# Patient Record
Sex: Female | Born: 1975 | Race: Black or African American | Hispanic: No | Marital: Single | Smoking: Never smoker
Health system: Southern US, Community
[De-identification: ages and names within clinical notes are randomized; demographics above are authoritative.]

---

## 1997-06-16 ENCOUNTER — Observation Stay (HOSPITAL_COMMUNITY): Admission: AD | Admit: 1997-06-16 | Discharge: 1997-06-17 | Payer: Self-pay | Admitting: Obstetrics & Gynecology

## 1997-11-13 ENCOUNTER — Other Ambulatory Visit: Admission: RE | Admit: 1997-11-13 | Discharge: 1997-11-13 | Payer: Self-pay | Admitting: Obstetrics

## 1998-11-17 ENCOUNTER — Emergency Department (HOSPITAL_COMMUNITY): Admission: EM | Admit: 1998-11-17 | Discharge: 1998-11-17 | Payer: Self-pay | Admitting: Emergency Medicine

## 1999-02-08 ENCOUNTER — Emergency Department (HOSPITAL_COMMUNITY): Admission: EM | Admit: 1999-02-08 | Discharge: 1999-02-08 | Payer: Self-pay | Admitting: Emergency Medicine

## 2001-02-05 ENCOUNTER — Emergency Department (HOSPITAL_COMMUNITY): Admission: EM | Admit: 2001-02-05 | Discharge: 2001-02-05 | Payer: Self-pay | Admitting: Emergency Medicine

## 2002-01-01 ENCOUNTER — Emergency Department (HOSPITAL_COMMUNITY): Admission: EM | Admit: 2002-01-01 | Discharge: 2002-01-01 | Payer: Self-pay

## 2002-04-22 ENCOUNTER — Emergency Department (HOSPITAL_COMMUNITY): Admission: EM | Admit: 2002-04-22 | Discharge: 2002-04-22 | Payer: Self-pay | Admitting: Emergency Medicine

## 2002-04-26 ENCOUNTER — Emergency Department (HOSPITAL_COMMUNITY): Admission: EM | Admit: 2002-04-26 | Discharge: 2002-04-27 | Payer: Self-pay | Admitting: Emergency Medicine

## 2004-11-13 ENCOUNTER — Emergency Department (HOSPITAL_COMMUNITY): Admission: EM | Admit: 2004-11-13 | Discharge: 2004-11-13 | Payer: Self-pay | Admitting: Family Medicine

## 2006-07-03 ENCOUNTER — Emergency Department (HOSPITAL_COMMUNITY): Admission: EM | Admit: 2006-07-03 | Discharge: 2006-07-04 | Payer: Self-pay | Admitting: Emergency Medicine

## 2006-07-08 ENCOUNTER — Emergency Department (HOSPITAL_COMMUNITY): Admission: EM | Admit: 2006-07-08 | Discharge: 2006-07-08 | Payer: Self-pay | Admitting: Family Medicine

## 2006-08-17 ENCOUNTER — Emergency Department (HOSPITAL_COMMUNITY): Admission: EM | Admit: 2006-08-17 | Discharge: 2006-08-17 | Payer: Self-pay | Admitting: Emergency Medicine

## 2008-02-28 ENCOUNTER — Emergency Department (HOSPITAL_COMMUNITY): Admission: EM | Admit: 2008-02-28 | Discharge: 2008-02-28 | Payer: Self-pay | Admitting: Family Medicine

## 2008-05-19 ENCOUNTER — Emergency Department (HOSPITAL_COMMUNITY): Admission: EM | Admit: 2008-05-19 | Discharge: 2008-05-19 | Payer: Self-pay | Admitting: Family Medicine

## 2008-05-24 ENCOUNTER — Emergency Department (HOSPITAL_COMMUNITY): Admission: EM | Admit: 2008-05-24 | Discharge: 2008-05-24 | Payer: Self-pay | Admitting: Family Medicine

## 2010-05-06 LAB — WET PREP, GENITAL: Trich, Wet Prep: NONE SEEN

## 2010-05-06 LAB — POCT URINALYSIS DIP (DEVICE)
Bilirubin Urine: NEGATIVE
Nitrite: POSITIVE — AB
Protein, ur: >=300 mg/dL — AB
Urobilinogen, UA: 2 mg/dL — ABNORMAL HIGH (ref 0.0–1.0)
pH: 6 (ref 5.0–8.0)
pH: 8.5 — ABNORMAL HIGH (ref 5.0–8.0)

## 2010-05-06 LAB — GC/CHLAMYDIA PROBE AMP, GENITAL
Chlamydia, DNA Probe: NEGATIVE
GC Probe Amp, Genital: NEGATIVE

## 2010-05-06 LAB — URINE CULTURE: Colony Count: 15000

## 2010-05-06 LAB — POCT PREGNANCY, URINE: Preg Test, Ur: NEGATIVE

## 2010-11-09 LAB — DIFFERENTIAL
Eosinophils Absolute: 0
Lymphocytes Relative: 10 — ABNORMAL LOW
Lymphs Abs: 0.7
Monocytes Relative: 5
Neutrophils Relative %: 86 — ABNORMAL HIGH

## 2010-11-09 LAB — URINE MICROSCOPIC-ADD ON

## 2010-11-09 LAB — CBC
MCHC: 32.8
RDW: 13.1

## 2010-11-09 LAB — URINALYSIS, ROUTINE W REFLEX MICROSCOPIC
Glucose, UA: NEGATIVE
Ketones, ur: 40 — AB
Nitrite: NEGATIVE
Specific Gravity, Urine: 1.015
pH: 7.5

## 2010-11-09 LAB — COMPREHENSIVE METABOLIC PANEL
ALT: 17
AST: 35
Calcium: 9.3
Creatinine, Ser: 0.73
GFR calc Af Amer: >60
Glucose, Bld: 96
Sodium: 133 — ABNORMAL LOW
Total Protein: 7.9

## 2010-11-12 LAB — I-STAT 8, (EC8 V) (CONVERTED LAB)
Acid-base deficit: 2
BUN: 5 — ABNORMAL LOW
Bicarbonate: 22.7
Chloride: 109
Glucose, Bld: 85
HCT: 37
Hemoglobin: 12.6
Operator id: 239701
Potassium: 4
Sodium: 140
TCO2: 24
pCO2, Ven: 39.6 — ABNORMAL LOW
pH, Ven: 7.366 — ABNORMAL HIGH

## 2010-11-12 LAB — URINALYSIS, ROUTINE W REFLEX MICROSCOPIC
Bilirubin Urine: NEGATIVE
Glucose, UA: NEGATIVE
Ketones, ur: NEGATIVE
Protein, ur: NEGATIVE
Urobilinogen, UA: 1

## 2010-11-12 LAB — URINE MICROSCOPIC-ADD ON

## 2010-11-12 LAB — HEMOGLOBIN AND HEMATOCRIT, BLOOD: HCT: 36.6

## 2010-11-12 LAB — PREGNANCY, URINE: Preg Test, Ur: NEGATIVE

## 2011-03-10 ENCOUNTER — Encounter (HOSPITAL_COMMUNITY): Payer: Self-pay | Admitting: *Deleted

## 2011-03-10 ENCOUNTER — Emergency Department (HOSPITAL_COMMUNITY): Payer: Self-pay

## 2011-03-10 ENCOUNTER — Emergency Department (HOSPITAL_COMMUNITY)
Admission: EM | Admit: 2011-03-10 | Discharge: 2011-03-10 | Disposition: A | Discharge status: Home / Self Care | Payer: Self-pay | Attending: Emergency Medicine | Admitting: Emergency Medicine

## 2011-03-10 DIAGNOSIS — J189 Pneumonia, unspecified organism: Secondary | ICD-10-CM | POA: Insufficient documentation

## 2011-03-10 DIAGNOSIS — R059 Cough, unspecified: Secondary | ICD-10-CM | POA: Insufficient documentation

## 2011-03-10 DIAGNOSIS — R062 Wheezing: Secondary | ICD-10-CM | POA: Insufficient documentation

## 2011-03-10 DIAGNOSIS — R05 Cough: Secondary | ICD-10-CM | POA: Insufficient documentation

## 2011-03-10 MED ORDER — HYDROCODONE-HOMATROPINE 5-1.5 MG/5ML PO SYRP: 5.0000 mL | ORAL_SOLUTION | Freq: Four times a day (QID) | ORAL | Status: AC | PRN

## 2011-03-10 MED ORDER — AZITHROMYCIN 250 MG PO TABS: ORAL_TABLET | ORAL | Status: AC

## 2011-03-10 NOTE — ED Notes (Signed)
Pt states she is just recently getting over the flu and still has a nagging dry cough. Pt denies any chest pain. Pt states she not having a sore throat just a cough

## 2011-03-10 NOTE — Discharge Instructions (Signed)
Take medications as prescribed. Followup with your doctor in regards to your hospital visit. If you do not have a doctor use the resource guide listed below to help you find one. You may return to the emergency department if symptoms worsen, become progressive, or become more concerning.  Pneumonia, Adult Pneumonia is an infection of the lungs.  CAUSES Pneumonia may be caused by bacteria or a virus. Usually, these infections are caused by breathing infectious particles into the lungs (respiratory tract). SYMPTOMS   Cough.   Fever.   Chest pain.   Increased rate of breathing.   Wheezing.   Mucus production.  DIAGNOSIS  If you have the common symptoms of pneumonia, your caregiver will typically confirm the diagnosis with a chest X-ray. The X-ray will show an abnormality in the lung (pulmonary infiltrate) if you have pneumonia. Other tests of your blood, urine, or sputum may be done to find the specific cause of your pneumonia. Your caregiver may also do tests (blood gases or pulse oximetry) to see how well your lungs are working. TREATMENT  Some forms of pneumonia may be spread to other people when you cough or sneeze. You may be asked to wear a mask before and during your exam. Pneumonia that is caused by bacteria is treated with antibiotic medicine. Pneumonia that is caused by the influenza virus may be treated with an antiviral medicine. Most other viral infections must run their course. These infections will not respond to antibiotics.  PREVENTION A pneumococcal shot (vaccine) is available to prevent a common bacterial cause of pneumonia. This is usually suggested for:  People over 65 years old.   Patients on chemotherapy.   People with chronic lung problems, such as bronchitis or emphysema.   People with immune system problems.  If you are over 65 or have a high risk condition, you may receive the pneumococcal vaccine if you have not received it before. In some countries, a  routine influenza vaccine is also recommended. This vaccine can help prevent some cases of pneumonia.You may be offered the influenza vaccine as part of your care. If you smoke, it is time to quit. You may receive instructions on how to stop smoking. Your caregiver can provide medicines and counseling to help you quit. HOME CARE INSTRUCTIONS   Cough suppressants may be used if you are losing too much rest. However, coughing protects you by clearing your lungs. You should avoid using cough suppressants if you can.   Your caregiver may have prescribed medicine if he or she thinks your pneumonia is caused by a bacteria or influenza. Finish your medicine even if you start to feel better.   Your caregiver may also prescribe an expectorant. This loosens the mucus to be coughed up.   Only take over-the-counter or prescription medicines for pain, discomfort, or fever as directed by your caregiver.   Do not smoke. Smoking is a common cause of bronchitis and can contribute to pneumonia. If you are a smoker and continue to smoke, your cough may last several weeks after your pneumonia has cleared.   A cold steam vaporizer or humidifier in your room or home may help loosen mucus.   Coughing is often worse at night. Sleeping in a semi-upright position in a recliner or using a couple pillows under your head will help with this.   Get rest as you feel it is needed. Your body will usually let you know when you need to rest.  SEEK IMMEDIATE MEDICAL CARE IF:     Your illness becomes worse. This is especially true if you are elderly or weakened from any other disease.   You cannot control your cough with suppressants and are losing sleep.   You begin coughing up blood.   You develop pain which is getting worse or is uncontrolled with medicines.   You have a fever.   Any of the symptoms which initially brought you in for treatment are getting worse rather than better.   You develop shortness of breath or  chest pain.  MAKE SURE YOU:   Understand these instructions.   Will watch your condition.   Will get help right away if you are not doing well or get worse.   RESOURCE GUIDE  Dental Problems  Patients with Medicaid: [facility]                     [facility] [address]                                           [address] Phone:  [phone]                                                  Phone:  [phone]  If unable to pay or uninsured, contact:  [facility] or [facility] to become qualified for the adult dental clinic.  Chronic Pain Problems Contact [facility]  [phone] Patients need to be referred by their primary care doctor.  Insufficient Money for Medicine Contact [employer]:  call "211" or [facility] 271-5999.  No Primary Care Doctor Call [facility]  832-8000 Other agencies that provide inexpensive medical care    [facility]  832-8035    [facility]  832-7272    [facility]  [phone]    [facility] Clinic  832-4777    [facility]  [phone]    [facility]  272-1050  Psychological Services [facility]  832-9600 [facility]  [phone] [facility]   800 853-5163 (emergency services 641-4993)  Substance Abuse Resources [facility]  336-882-2125 [facility] 336-784-9470 [facility] 336-285-9073 [facility] 336-845-3988 Residential & Outpatient Substance Abuse Program  800-659-3381  Abuse/Neglect [place] Child Abuse Hotline (336) 641-3795 [place] [facility] Abuse Hotline 800-378-5315 (After Hours)  Emergency Shelter [facility] (336) 271-5985  [facility] (336) 275-9566 [facility] (704) 372-4663  MRSA Hotline #:   832-7006    [place] [facility]  [facility] [place]     [employer]                          [facility] [address] [place]                       [address]      [address]  [place]                                                  [place]                            [place] Phone:  [phone]                                   Phone:  [phone]                 Phone:  [phone]  [facility] Phone:  342-8316  [place] Child Abuse Hotline (336) 342-1394 (336) 342-3537 (After Hours)   

## 2011-03-10 NOTE — ED Provider Notes (Signed)
Medical screening examination/treatment/procedure(s) were performed by non-physician practitioner and as supervising physician I was immediately available for consultation/collaboration.  Loucille Takach, MD 03/10/11 1614 

## 2011-03-10 NOTE — ED Provider Notes (Signed)
History     CSN: [id number]  Arrival date & time 03/10/11  1133   First MD Initiated Contact with Patient 03/10/11 1143      Chief Complaint  Patient presents with  . Cough    (Consider location/radiation/quality/duration/timing/severity/associated sxs/prior treatment) HPI Comments: Patient presents emergency Department with chief complaint of cough.  Patient states she recently got over the flu and she's had a lingering cough for the last 2 weeks.  Patient denies sore throat, fevers, night sweats, chills, headache, earache, shortness of breath, chest pain.  Patient is a complaint this time.  Patient denies a history of smoking,  Asthma, or COPD.  Patient is a 36 y.o. female presenting with cough. The history is provided by the patient.  Cough This is a new problem. The current episode started more than 1 week ago. The problem occurs every few minutes. The problem has not changed since onset.The cough is non-productive. There has been no fever. Pertinent negatives include no chest pain, no chills, no sweats, no ear congestion, no ear pain, no headaches, no rhinorrhea, no sore throat, no myalgias, no shortness of breath, no wheezing and no eye redness. She has tried cough syrup for the symptoms. The treatment provided mild relief. She is not a smoker. Her past medical history does not include bronchitis, pneumonia, bronchiectasis, COPD, emphysema or asthma.    History reviewed. No pertinent past medical history.  History reviewed. No pertinent past surgical history.  History reviewed. No pertinent family history.  History  Substance Use Topics  . Smoking status: Never Smoker   . Smokeless tobacco: Not on file  . Alcohol Use: Yes     occa    OB History    Grav Para Term Preterm Abortions TAB SAB Ect Mult Living                  Review of Systems  Constitutional: Negative for chills.  HENT: Negative for ear pain, sore throat and rhinorrhea.   Eyes: Negative for redness.    Respiratory: Positive for cough. Negative for shortness of breath and wheezing.   Cardiovascular: Negative for chest pain.  Musculoskeletal: Negative for myalgias.  Neurological: Negative for headaches.  All other systems reviewed and are negative.    Allergies  Penicillins  Home Medications   Current Outpatient Rx  Name Route Sig Dispense Refill  . GUAIFENESIN 100 MG/5ML PO SYRP Oral Take 200 mg by mouth 3 (three) times daily as needed. For congestion    . NYQUIL D COLD/FLU PO Oral Take 30 mLs by mouth at bedtime.    Marland Kitchen PSEUDOEPHEDRINE-GUAIFENESIN ER 60-600 MG PO TB12 Oral Take 1 tablet by mouth every 12 (twelve) hours.      BP 113/71  Pulse 90  Temp(Src) 98.7 F (37.1 C) (Oral)  Resp 20  Ht 5' (1.524 m)  Wt 130 lb (58.968 kg)  BMI 25.39 kg/m2  SpO2 99%  LMP 02/24/2011  Physical Exam  Nursing note and vitals reviewed. Constitutional: She is oriented to person, place, and time. She appears well-developed and well-nourished. No distress.  HENT:  Head: Normocephalic and atraumatic. No trismus in the jaw.  Right Ear: External ear normal. No drainage or tenderness. No mastoid tenderness.  Left Ear: External ear normal. No drainage or tenderness. No mastoid tenderness.  Nose: Nose normal. No rhinorrhea or sinus tenderness.  Mouth/Throat: Uvula is midline, oropharynx is clear and moist and mucous membranes are normal. No uvula swelling. No oropharyngeal exudate.  Eyes: Conjunctivae and  EOM are normal. Right eye exhibits no discharge. Left eye exhibits no discharge. No scleral icterus.  Neck: Normal range of motion. Neck supple.  Cardiovascular: Normal rate, regular rhythm and normal heart sounds.   Pulmonary/Chest: Effort normal and breath sounds normal. No stridor. No respiratory distress. She has no wheezes. She exhibits tenderness.  Abdominal: Soft. There is no tenderness.  Musculoskeletal: Normal range of motion.  Neurological: She is alert and oriented to person, place,  and time.  Skin: Skin is warm and dry. No rash noted. She is not diaphoretic.  Psychiatric: She has a normal mood and affect. Her behavior is normal.    ED Course  Procedures (including critical care time)  Labs Reviewed - No data to display Dg Chest 2 View  03/10/2011  *RADIOLOGY REPORT*  Clinical Data: Cough and congestion.  CHEST - 2 VIEW  Comparison: None.  Findings: Trachea is midline.  Heart size normal.  There is left perihilar air space opacification.  Lungs are otherwise clear.  No pleural fluid.  IMPRESSION: Left perihilar air space opacification is likely due to pneumonia. Follow-up to clearing is recommended.  Original Report Authenticated By: Reyes Ivan, M.D.     No diagnosis found.    MDM  CAP   Patient has been diagnosed with CAP via chest xray. Pt is not ill appearing, immunocompromised, and does not have multiple co morbidities, therefore I feel like the they can be treated as an OP with abx therapy. Pt has been advised to return to the ED if symptoms worsen or they do not improve. Pt has been advised to call [facility] to help her find a PCP to follow up with to assure pneumonia has been treated appropriately. Pt verbalizes understanding and is agreeable with plan.         Jaci Carrel, [place] 03/10/11 1258

## 2012-01-31 ENCOUNTER — Emergency Department (HOSPITAL_COMMUNITY)
Admission: EM | Admit: 2012-01-31 | Discharge: 2012-02-01 | Disposition: A | Discharge status: Home / Self Care | Payer: Medicaid Other | Attending: Emergency Medicine | Admitting: Emergency Medicine

## 2012-01-31 ENCOUNTER — Encounter (HOSPITAL_COMMUNITY): Payer: Self-pay | Admitting: Emergency Medicine

## 2012-01-31 DIAGNOSIS — H5710 Ocular pain, unspecified eye: Secondary | ICD-10-CM | POA: Insufficient documentation

## 2012-01-31 DIAGNOSIS — Y93G3 Activity, cooking and baking: Secondary | ICD-10-CM | POA: Insufficient documentation

## 2012-01-31 DIAGNOSIS — R51 Headache: Secondary | ICD-10-CM | POA: Insufficient documentation

## 2012-01-31 DIAGNOSIS — Y929 Unspecified place or not applicable: Secondary | ICD-10-CM | POA: Insufficient documentation

## 2012-01-31 DIAGNOSIS — T300 Burn of unspecified body region, unspecified degree: Secondary | ICD-10-CM

## 2012-01-31 DIAGNOSIS — X12XXXA Contact with other hot fluids, initial encounter: Secondary | ICD-10-CM | POA: Insufficient documentation

## 2012-01-31 DIAGNOSIS — T23009A Burn of unspecified degree of unspecified hand, unspecified site, initial encounter: Secondary | ICD-10-CM | POA: Insufficient documentation

## 2012-01-31 MED ORDER — HYDROCODONE-ACETAMINOPHEN 5-325 MG PO TABS: 1.0000 | ORAL_TABLET | ORAL | Status: DC | PRN

## 2012-01-31 NOTE — ED Notes (Signed)
The pts rt and lt eyes iis 2040
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
## 2012-02-01 NOTE — ED Provider Notes (Signed)
Medical screening examination/treatment/procedure(s) were performed by non-physician practitioner and as supervising physician I was immediately available for consultation/collaboration.  Krystel Fletchall M Konstantin Lehnen, MD 02/01/12 0720 

## 2012-03-01 ENCOUNTER — Other Ambulatory Visit (HOSPITAL_COMMUNITY): Payer: Self-pay | Admitting: Obstetrics

## 2012-03-01 DIAGNOSIS — Z1231 Encounter for screening mammogram for malignant neoplasm of breast: Secondary | ICD-10-CM

## 2012-03-09 ENCOUNTER — Ambulatory Visit (HOSPITAL_COMMUNITY): Admission: RE | Admit: 2012-03-09 | Payer: Medicaid Other | Source: Ambulatory Visit

## 2012-03-16 ENCOUNTER — Ambulatory Visit (HOSPITAL_COMMUNITY)
Admission: RE | Admit: 2012-03-16 | Discharge: 2012-03-16 | Disposition: A | Discharge status: Home / Self Care | Payer: Medicaid Other | Source: Ambulatory Visit | Attending: Obstetrics | Admitting: Obstetrics

## 2012-03-16 DIAGNOSIS — Z1231 Encounter for screening mammogram for malignant neoplasm of breast: Secondary | ICD-10-CM | POA: Insufficient documentation

## 2012-06-28 ENCOUNTER — Encounter (HOSPITAL_COMMUNITY): Payer: Self-pay | Admitting: *Deleted

## 2012-06-28 ENCOUNTER — Emergency Department (HOSPITAL_COMMUNITY)
Admission: EM | Admit: 2012-06-28 | Discharge: 2012-06-28 | Disposition: A | Discharge status: Home / Self Care | Payer: Medicaid Other | Source: Home / Self Care | Attending: Family Medicine | Admitting: Family Medicine

## 2012-06-28 DIAGNOSIS — K0889 Other specified disorders of teeth and supporting structures: Secondary | ICD-10-CM

## 2012-06-28 MED ORDER — KETOROLAC TROMETHAMINE 10 MG PO TABS: 10.0000 mg | ORAL_TABLET | Freq: Four times a day (QID) | ORAL | Status: DC | PRN

## 2012-06-28 MED ORDER — CLINDAMYCIN HCL 150 MG PO CAPS: 150.0000 mg | ORAL_CAPSULE | Freq: Four times a day (QID) | ORAL | Status: DC

## 2012-06-28 NOTE — ED Provider Notes (Addendum)
History     CSN: [id number]  Arrival date & time 06/28/12  1527   First MD Initiated Contact with Patient 06/28/12 1534      Chief Complaint  Patient presents with  . Dental Pain    (Consider location/radiation/quality/duration/timing/severity/associated sxs/prior treatment) Patient is a 37 y.o. female presenting with tooth pain. The history is provided by the patient.  Dental Pain Location:  Lower Lower teeth location:  18/LL 2nd molar Quality:  Aching Severity:  Moderate Duration:  1 week Progression:  Worsening Chronicity:  New Context: dental caries   Associated symptoms: gum swelling   Associated symptoms: no fever     History reviewed. No pertinent past medical history.  History reviewed. No pertinent past surgical history.  History reviewed. No pertinent family history.  History  Substance Use Topics  . Smoking status: Never Smoker   . Smokeless tobacco: Not on file  . Alcohol Use: Yes     Comment: occa    OB History   Grav Para Term Preterm Abortions TAB SAB Ect Mult Living                  Review of Systems  Constitutional: Negative.  Negative for fever.  HENT: Positive for dental problem.     Allergies  Penicillins  Home Medications   Current Outpatient Rx  Name  Route  Sig  Dispense  Refill  . clindamycin (CLEOCIN) 150 MG capsule   Oral   Take 1 capsule (150 mg total) by mouth 4 (four) times daily.   28 capsule   0   . HYDROcodone-acetaminophen (NORCO) 5-325 MG per tablet   Oral   Take 1 tablet by mouth every 4 (four) hours as needed for pain.   8 tablet   0   . ketorolac (TORADOL) 10 MG tablet   Oral   Take 1 tablet (10 mg total) by mouth every 6 (six) hours as needed for pain.   28 tablet   0   . Pseudoeph-Doxylamine-DM-APAP (NYQUIL D COLD/FLU PO)   Oral   Take 30 mLs by mouth at bedtime.           BP 120/70  Pulse 72  Temp(Src) 99.2 F (37.3 C) (Oral)  Resp 14  SpO2 98%  LMP 06/20/2012  Physical Exam  Nursing  note and vitals reviewed. Constitutional: She appears well-developed and well-nourished.  HENT:  Head: Normocephalic.  Right Ear: External ear normal.  Left Ear: External ear normal.  Mouth/Throat: Oropharynx is clear and moist and mucous membranes are normal.      ED Course  Procedures (including critical care time)  Labs Reviewed - No data to display No results found.   1. Pain, dental       MDM          Linna Hoff, MD 06/28/12 4098  Linna Hoff, MD 06/28/12 [phone]

## 2012-06-28 NOTE — ED Notes (Signed)
Pt  Reports  She  Bit  Into  Some  Peanut  Brittle    About  1  Week  Ago  And   Felt  Pain  Pt  Says  She  Has  A  Cracked  Tooth    In that  Area       Pt  Reports      Pain  And  Swelling in the  l  Lower   Jaw  Area

## 2012-06-29 NOTE — ED Notes (Signed)
Patient arrived out front and questioned having stomach upset/diarrhea/dizziness.  Spoke to dr Lorenz Coaster, reported patient needed to stop clindamycin and ketorolac.  Called into [facility] erythromycin 333 1 tid po, #30, generic flagyl 500mg  1 tid#30, tramadol 50mg  #30, 2 every 8 hours as needed for pain   .  [facility] [address] and notified patient of change in medicine

## 2012-06-29 NOTE — ED Notes (Addendum)
Spoke to pharmacy: reported erythromycin 333 is not paid by medicaid, only 400 or 250, dr Lorenz Coaster aware and ordered 400mg , change made-pharmacy accepted order change

## 2012-10-23 ENCOUNTER — Emergency Department (HOSPITAL_COMMUNITY)
Admission: EM | Admit: 2012-10-23 | Discharge: 2012-10-23 | Disposition: A | Discharge status: Home / Self Care | Payer: Self-pay | Source: Home / Self Care | Attending: Emergency Medicine | Admitting: Emergency Medicine

## 2012-10-23 ENCOUNTER — Encounter (HOSPITAL_COMMUNITY): Payer: Self-pay | Admitting: Emergency Medicine

## 2012-10-23 DIAGNOSIS — K0401 Reversible pulpitis: Secondary | ICD-10-CM

## 2012-10-23 MED ORDER — OXYCODONE-ACETAMINOPHEN 5-325 MG PO TABS: ORAL_TABLET | ORAL | Status: DC

## 2012-10-23 MED ORDER — HYDROCODONE-ACETAMINOPHEN 5-325 MG PO TABS: 2.0000 | ORAL_TABLET | Freq: Once | ORAL | Status: DC

## 2012-10-23 MED ORDER — MELOXICAM 15 MG PO TABS: 15.0000 mg | ORAL_TABLET | Freq: Every day | ORAL | Status: DC

## 2012-10-23 MED ORDER — HYDROCODONE-ACETAMINOPHEN 5-325 MG PO TABS
ORAL_TABLET | ORAL | Status: AC
  Filled 2012-10-23: qty 2

## 2012-10-23 MED ORDER — ERYTHROMYCIN BASE 333 MG PO TBEC: 333.0000 mg | DELAYED_RELEASE_TABLET | Freq: Three times a day (TID) | ORAL | Status: DC

## 2012-10-23 NOTE — ED Provider Notes (Signed)
Chief Complaint:   Chief Complaint  Patient presents with  . Dental Pain    History of Present Illness:   Amy Medina is a 37 year old female who has had a toothache for the past 2 days. She ate a candy apple and her right, upper wisdom tooth cracked. She has multiple carious teeth and is scheduled to get 6 teeth pulled next week. The tooth is sensitive and painful. It hurts to chew on that side. There is no swelling of the gingiva or the cheek. She's had no fever or chills. She has had a slight headache but no ear or facial pain. No neck pain or swelling. No difficulty swallowing or breathing. No chest pain or shortness of breath.  Review of Systems:  Other than noted above, the patient denies any of the following symptoms: Systemic:  No fever, chills,  Or sweats. ENT:  No headache, ear ache, sore throat, nasal congestion, facial pain, or swelling. Lymphatic:  No adenopathy. Lungs:  No coughing, wheezing or shortness of breath.  PMFSH:  Past medical history, family history, social history, meds, and allergies were reviewed. She is allergic to penicillin, amoxicillin, and clindamycin.  Physical Exam:   Vital signs:  LMP 10/09/2012 General:  Alert, oriented, in no distress. ENT:  TMs and canals normal.  Nasal mucosa normal. Mouth exam:  Her right upper wisdom tooth is broken off posteriorly. There is no swelling of the gingiva. She also has a crack in her right lower wisdom tooth. There is no swelling of the floor the mouth. The pharynx was clear and widely patent. Neck:  No swelling or adenopathy. Lungs:  Breath sounds clear and equal bilaterally.  No wheezes, rales or rhonchi. Heart:  Regular rhythm.  No gallops or murmers. Skin:  Clear, warm and dry.   Course in Urgent Care Center:   Since she was driving home from here she was not given any pain medication here, but does have a prescription for Percocet that she can take when she gets home.  Assessment:  The encounter diagnosis  was Pulpitis.  Plan:   1.  Meds:  The following meds were prescribed:   Discharge Medication List as of 10/23/2012  8:45 PM    START taking these medications   Details  erythromycin (ERY-TAB) 333 MG EC tablet Take 1 tablet (333 mg total) by mouth 3 (three) times daily., Starting 10/23/2012, Until Discontinued, Normal    meloxicam (MOBIC) 15 MG tablet Take 1 tablet (15 mg total) by mouth daily., Starting 10/23/2012, Until Discontinued, Normal    oxyCODONE-acetaminophen (PERCOCET) 5-325 MG per tablet 1 to 2 tablets every 6 hours as needed for pain., Print        2.  Patient Education/Counseling:  The patient was given appropriate handouts, self care instructions, and instructed in symptomatic relief. Suggested sleeping with head of bed elevated and hot salt water mouthwash.   3.  Follow up:  The patient was told to follow up if no better in 3 to 4 days, if becoming worse in any way, and given some red flag symptoms such as difficulty swallowing or breathing which would prompt immediate return.  Follow up with a dentist as soon as posssible.     Reuben Likes, MD 10/23/12 [phone]

## 2012-10-23 NOTE — ED Notes (Signed)
C/o dental pain on right top of mouth for a couple of days now.  Patient is scheduled to get six teeth pulled next week.

## 2013-03-17 ENCOUNTER — Emergency Department (HOSPITAL_COMMUNITY)
Admission: EM | Admit: 2013-03-17 | Discharge: 2013-03-17 | Disposition: A | Discharge status: Home / Self Care | Payer: Medicaid Other | Source: Home / Self Care | Attending: Emergency Medicine | Admitting: Emergency Medicine

## 2013-03-17 ENCOUNTER — Encounter (HOSPITAL_COMMUNITY): Payer: Self-pay | Admitting: Emergency Medicine

## 2013-03-17 DIAGNOSIS — K089 Disorder of teeth and supporting structures, unspecified: Secondary | ICD-10-CM

## 2013-03-17 DIAGNOSIS — K0889 Other specified disorders of teeth and supporting structures: Secondary | ICD-10-CM

## 2013-03-17 MED ORDER — OXYCODONE-ACETAMINOPHEN 5-325 MG PO TABS
ORAL_TABLET | ORAL | Status: DC
Start: 2013-03-17 — End: 2013-03-19

## 2013-03-17 MED ORDER — METRONIDAZOLE 500 MG PO TABS: 500.0000 mg | ORAL_TABLET | Freq: Two times a day (BID) | ORAL | Status: DC

## 2013-03-17 MED ORDER — FLUCONAZOLE 150 MG PO TABS: 150.0000 mg | ORAL_TABLET | Freq: Once | ORAL | Status: DC

## 2013-03-17 MED ORDER — DOXYCYCLINE HYCLATE 100 MG PO TABS: 100.0000 mg | ORAL_TABLET | Freq: Two times a day (BID) | ORAL | Status: DC

## 2013-03-17 NOTE — ED Notes (Signed)
Pt    Reports  Pain in  Her  Mouth    Pt     Reports   Scheduled    To have  Teeth  Pulled          In the  Near  Future        Pt  Reports  She  Has  Taken  Motrin and  hydrocodone  For  The  Pain

## 2013-03-17 NOTE — ED Provider Notes (Signed)
  Chief Complaint   Chief Complaint  Patient presents with  . Dental Pain    History of Present Illness   Amy Medina is a 38 year old female who has had a several week history of pain in all of her teeth, both right and left, upper and lower. It's worse in the right side. There is no swelling. It hurts to chew on either side. She has no difficulty opening her mouth. No trouble swallowing or breathing. The pain radiates to her ear is, the top of her head and over her entire face. She denies any fever or chills, or shortness of breath.  Review of Systems   Other than as noted above, the patient denies any of the following symptoms: Systemic:  No fever, chills,  Or sweats. ENT:  No headache, ear ache, sore throat, nasal congestion, facial pain, or swelling. Lymphatic:  No adenopathy. Lungs:  No coughing, wheezing or shortness of breath.  [place]   Past medical history, family history, social history, meds, and allergies were reviewed. She's allergic to penicillin, clindamycin, and Toradol. She's currently taking ibuprofen and hydrocodone without relief.  Physical Examination     Vital signs:  BP 133/90  Pulse 84  Temp(Src) 98.9 F (37.2 C) (Oral)  Resp 16  SpO2 100%  LMP 01/25/2013 General:  Alert, oriented, in no distress. ENT:  TMs and canals normal.  Nasal mucosa normal. Mouth exam:  Teeth were in surprisingly good repair. She has one wisdom tooth that is decayed on the left side in the mandible. No other visible decay. No swelling of the gingiva. No swelling of the tongue floor the mouth. The pharynx was clear airway widely patent. Neck:  No swelling or adenopathy. Lungs:  Breath sounds clear and equal bilaterally.  No wheezes, rales or rhonchi. Heart:  Regular rhythm.  No gallops or murmers. Skin:  Clear, warm and dry.   Assessment   The encounter diagnosis was Pain, dental.  She's got widespread dental pain without much the way of objective findings. She does have  an appointment to see her dentist in a week or 2. Since she has multiple allergies we'll treat with doxycycline and metronidazole.  Plan   1.  Meds:  The following meds were prescribed:   New Prescriptions   DOXYCYCLINE (VIBRA-TABS) 100 MG TABLET    Take 1 tablet (100 mg total) by mouth 2 (two) times daily.   FLUCONAZOLE (DIFLUCAN) 150 MG TABLET    Take 1 tablet (150 mg total) by mouth once.   METRONIDAZOLE (FLAGYL) 500 MG TABLET    Take 1 tablet (500 mg total) by mouth 2 (two) times daily.   OXYCODONE-ACETAMINOPHEN (PERCOCET) 5-325 MG PER TABLET    1 to 2 tablets every 6 hours as needed for pain.    2.  Patient Education/Counseling:  The patient was given appropriate handouts, self care instructions, and instructed in symptomatic relief. Suggested sleeping with head of bed elevated and hot salt water mouthwash.   3.  Follow up:  The patient was told to follow up if no better in 3 to 4 days, if becoming worse in any way, and given some red flag symptoms such as difficulty swallowing or breathing which would prompt immediate return.  Follow up with a dentist as soon as posssible.     Harden Mo, MD 03/17/13 [phone]

## 2013-03-17 NOTE — Discharge Instructions (Signed)
Look up the [employer] for free dental clinics. Http://www.ncdental.org/ncds/Schedule.asp ° °Get there early and be prepared to wait. [employer] and [employer] have dental hygienist schools that provide low cost routine dental care.  ° °Other resources: °[facility] °[address] °[place], [place] °[phone] ° °Patients with Medicaid: °[facility]                     [facility] °[address]                                [address] °Phone:  [phone]                                                  Phone:  [phone] ° °If unable to pay or uninsured, contact:  [facility] or [facility] to become qualified for the adult dental clinic. ° °No matter what dental problem you have, it will not get better unless you get good dental care.  If the tooth is not taken care of, your symptoms will come back in time and you will be visiting us again in the Urgent Care Center with a bad toothache.  So, see your dentist as soon as possible.  If you don't have a dentist, we can give you a list of dentists.  Sometimes the most cost effective treatment is removal of the tooth.  This can be done very inexpensively through one of the low cost [facility] Centers such as the facility on [address] in [place] ([phone]).  The downside to this is that you will have one less tooth and this can effect your ability to chew. ° °Some other things that can be done for a dental infection include the following: ° °· Rinse your mouth out with hot salt water (1/2 tsp of table salt and a pinch of baking soda in 8 oz of hot water).  You can do this every 2 or 3 hours. °· Avoid cold foods, beverages, and cold air.  This will make your symptoms worse. °· Sleep with your head elevated.  Sleeping flat will cause your gums and oral tissues to swell and make them hurt more.  You can sleep on several pillows.  Even  better is to sleep in a recliner with your head higher than your heart. °· For mild to moderate pain, you can take Tylenol, ibuprofen, or Aleve. °· External application of heat by a heating pad, hot water bottle, or hot wet towel can help with pain and speed healing.  You can do this every 2 to 3 hours. Do not fall asleep on a heating pad since this can cause a burn.  °·  °

## 2013-03-19 ENCOUNTER — Emergency Department (HOSPITAL_COMMUNITY)
Admission: EM | Admit: 2013-03-19 | Discharge: 2013-03-19 | Disposition: A | Discharge status: Home / Self Care | Payer: Medicaid Other | Attending: Emergency Medicine | Admitting: Emergency Medicine

## 2013-03-19 ENCOUNTER — Encounter (HOSPITAL_COMMUNITY): Payer: Self-pay | Admitting: Emergency Medicine

## 2013-03-19 DIAGNOSIS — R51 Headache: Secondary | ICD-10-CM | POA: Insufficient documentation

## 2013-03-19 DIAGNOSIS — R112 Nausea with vomiting, unspecified: Secondary | ICD-10-CM | POA: Insufficient documentation

## 2013-03-19 DIAGNOSIS — Z792 Long term (current) use of antibiotics: Secondary | ICD-10-CM | POA: Insufficient documentation

## 2013-03-19 DIAGNOSIS — J029 Acute pharyngitis, unspecified: Secondary | ICD-10-CM | POA: Insufficient documentation

## 2013-03-19 DIAGNOSIS — R220 Localized swelling, mass and lump, head: Secondary | ICD-10-CM | POA: Insufficient documentation

## 2013-03-19 DIAGNOSIS — Z880 Allergy status to penicillin: Secondary | ICD-10-CM | POA: Insufficient documentation

## 2013-03-19 DIAGNOSIS — H9209 Otalgia, unspecified ear: Secondary | ICD-10-CM | POA: Insufficient documentation

## 2013-03-19 DIAGNOSIS — Z791 Long term (current) use of non-steroidal anti-inflammatories (NSAID): Secondary | ICD-10-CM | POA: Insufficient documentation

## 2013-03-19 DIAGNOSIS — R221 Localized swelling, mass and lump, neck: Secondary | ICD-10-CM

## 2013-03-19 DIAGNOSIS — R110 Nausea: Secondary | ICD-10-CM

## 2013-03-19 DIAGNOSIS — K029 Dental caries, unspecified: Secondary | ICD-10-CM | POA: Insufficient documentation

## 2013-03-19 MED ORDER — ONDANSETRON HCL 4 MG PO TABS: 4.0000 mg | ORAL_TABLET | Freq: Four times a day (QID) | ORAL | Status: DC

## 2013-03-19 NOTE — Discharge Instructions (Signed)
Nausea, Adult Nausea is the feeling that you have an upset stomach or have to vomit. Nausea by itself is not likely a serious concern, but it may be an early sign of more serious medical problems. As nausea gets worse, it can lead to vomiting. If vomiting develops, there is the risk of dehydration.  CAUSES   Viral infections.  Food poisoning.  Medicines.  Pregnancy.  Motion sickness.  Migraine headaches.  Emotional distress.  Severe pain from any source.  Alcohol intoxication. HOME CARE INSTRUCTIONS  Get plenty of rest.  Ask your caregiver about specific rehydration instructions.  Eat small amounts of food and sip liquids more often.  Take all medicines as told by your caregiver. SEEK MEDICAL CARE IF:  You have not improved after 2 days, or you get worse.  You have a headache. SEEK IMMEDIATE MEDICAL CARE IF:   You have a fever.  You faint.  You keep vomiting or have blood in your vomit.  You are extremely weak or dehydrated.  You have dark or bloody stools.  You have severe chest or abdominal pain. MAKE SURE YOU:  Understand these instructions.  Will watch your condition.  Will get help right away if you are not doing well or get worse. Document Released: 02/19/2004 Document Revised: 10/06/2011 Document Reviewed: 09/23/2010 [facility] Patient Information 2014 [place], [place]. As discussed please take the Zofran tablet 30 minutes prior to taking antibiotic I would definitely take the doxycycline and your Percocet.  I don't, think that the Flagyl is necessary, and would eliminate this. Please call your dentist on a daily basis to see if there is a opening sooner than your scheduled appointment

## 2013-03-19 NOTE — ED Provider Notes (Signed)
CSN: [id number]     Arrival date & time 03/19/13  1909 History  This chart was scribed for non-physician practitioner Junius Creamer, NP working with Saddie Benders. Dorna Mai, MD by Zettie Pho, ED Scribe. This patient was seen in room WTR5/WTR5 and the patient's care was started at 8:51 PM.    Chief Complaint  Patient presents with  . Dental Pain   The history is provided by the patient. No language interpreter was used.   HPI Comments: Amy Medina is a 38 y.o. female who presents to the Emergency Department complaining of a constant pain diffusely to the dentition, worse on the right (upper and lower). She reports some associated swelling to the right side of the face, right ear pain, sore throat, and diffuse headache onset earlier today. Patient was seen at [facility] 2 days ago for similar complaints and was discharged with doxycycline, Flagyl, and 30 tablets of Percocet 5-325 mg with advice to follow up with a dentist. She states that has been taking the medications, but that it caused persistent nausea with multiple episodes of emesis. She reports that she has an appointment to follow up with a dentist in 1-2 weeks. Patient reports allergies to Toradol, clindamycin, and penicillins. Patient has no other pertinent medical history.   History reviewed. No pertinent past medical history. History reviewed. No pertinent past surgical history. No family history on file. History  Substance Use Topics  . Smoking status: Never Smoker   . Smokeless tobacco: Not on file  . Alcohol Use: Yes     Comment: occa   OB History   Grav Para Term Preterm Abortions TAB SAB Ect Mult Living                 Review of Systems  Constitutional: Negative for fever.  HENT: Positive for dental problem, ear pain, facial swelling and sore throat.   Gastrointestinal: Positive for nausea and vomiting.  Musculoskeletal: Negative for joint swelling.  Skin: Negative for rash.  Neurological: Positive for  headaches.  All other systems reviewed and are negative.   Allergies  Toradol; Clindamycin/lincomycin; and Penicillins  Home Medications   Current Outpatient Rx  Name  Route  Sig  Dispense  Refill  . doxycycline (VIBRA-TABS) 100 MG tablet   Oral   Take 1 tablet (100 mg total) by mouth 2 (two) times daily.   20 tablet   0   . HYDROcodone-acetaminophen (NORCO) 5-325 MG per tablet   Oral   Take 1 tablet by mouth every 4 (four) hours as needed for pain.   8 tablet   0   . ibuprofen (ADVIL,MOTRIN) 200 MG tablet   Oral   Take 600 mg by mouth every 6 (six) hours as needed (pain).         . metroNIDAZOLE (FLAGYL) 500 MG tablet   Oral   Take 1 tablet (500 mg total) by mouth 2 (two) times daily.   14 tablet   0   . oxyCODONE-acetaminophen (PERCOCET) 5-325 MG per tablet      1 to 2 tablets every 6 hours as needed for pain.   30 tablet   0   . ondansetron (ZOFRAN) 4 MG tablet   Oral   Take 1 tablet (4 mg total) by mouth every 6 (six) hours.   12 tablet   0    Triage Vitals: BP 130/87  Temp(Src) 98.4 F (36.9 C) (Oral)  Resp 18  Ht 5' (1.524 m)  SpO2 100%  LMP 03/19/2013  Physical Exam  Nursing note and vitals reviewed. Constitutional: She is oriented to person, place, and time. She appears well-developed and well-nourished. No distress.  HENT:  Head: Normocephalic and atraumatic.  Mouth/Throat: Dental caries present.  3rd, right, lower molar has a large cavity on the medial aspect. 3rd, right, upper molar has a large cavity on the posterior aspect.    Eyes: Conjunctivae are normal.  Neck: Normal range of motion. Neck supple. No tracheal deviation present.  Cardiovascular: Normal rate.   Pulmonary/Chest: Effort normal. No respiratory distress.  Abdominal: She exhibits no distension.  Musculoskeletal: Normal range of motion.  Lymphadenopathy:    She has no cervical adenopathy.  Neurological: She is alert and oriented to person, place, and time.  Skin: Skin  is warm and dry.  Psychiatric: She has a normal mood and affect. Her behavior is normal.    ED Course  Procedures (including critical care time)  DIAGNOSTIC STUDIES: Oxygen Saturation is 100% on room air, normal by my interpretation.    COORDINATION OF CARE: 8:52 PM- Discussed that a common side effect of the antibiotics is nausea and vomiting and to stagger the dosages rather than taking all of the medications at the same time. Will discharge patient with Zofran to manage symptoms. Advised patient to follow up with her dentist. Discussed treatment plan with patient at bedside and patient verbalized agreement.     Labs Review Labs Reviewed - No data to display Imaging Review No results found.  EKG Interpretation   None      I will add Zofran to the patient's medication regime.  I suggested she take her antibiotics separately, as well as the Percocet.  Also recommend that she take this or from 20-30 minutes prior to any other medication that she call her dentist on a daily basis to see if she can get an appointment, sooner. MDM   Final diagnoses:  Nausea       I personally performed the services described in this documentation, which was scribed in my presence. The recorded information has been reviewed and is accurate.    Garald Balding, NP 03/19/13 2105  Garald Balding, NP 03/20/13 [phone]

## 2013-03-19 NOTE — ED Notes (Signed)
Pt presents with c/o toothache, she says that all of her teeth are hurting. Was just seen at [facility] for the same 2 days ago.

## 2013-03-23 NOTE — ED Provider Notes (Signed)
Medical screening examination/treatment/procedure(s) were performed by non-physician practitioner and as supervising physician I was immediately available for consultation/collaboration.  Saddie Benders. Lucius Wise, MD 03/23/13 1048

## 2015-09-19 ENCOUNTER — Ambulatory Visit (HOSPITAL_COMMUNITY)
Admission: EM | Admit: 2015-09-19 | Discharge: 2015-09-19 | Disposition: A | Discharge status: Home / Self Care | Payer: Medicaid Other | Attending: Radiology | Admitting: Radiology

## 2015-09-19 ENCOUNTER — Encounter (HOSPITAL_COMMUNITY): Payer: Self-pay | Admitting: Emergency Medicine

## 2015-09-19 DIAGNOSIS — R51 Headache: Secondary | ICD-10-CM | POA: Diagnosis not present

## 2015-09-19 DIAGNOSIS — R519 Headache, unspecified: Secondary | ICD-10-CM

## 2015-09-19 MED ORDER — DEXAMETHASONE SODIUM PHOSPHATE 10 MG/ML IJ SOLN
INTRAMUSCULAR | Status: AC
  Filled 2015-09-19: qty 1

## 2015-09-19 MED ORDER — ONDANSETRON HCL 4 MG/2ML IJ SOLN
INTRAMUSCULAR | Status: AC
  Filled 2015-09-19: qty 2

## 2015-09-19 MED ORDER — ONDANSETRON HCL 4 MG/2ML IJ SOLN
4.0000 mg | Freq: Once | INTRAMUSCULAR | Status: AC
  Administered 2015-09-19: 4 mg via INTRAMUSCULAR

## 2015-09-19 MED ORDER — DEXAMETHASONE SODIUM PHOSPHATE 10 MG/ML IJ SOLN
10.0000 mg | Freq: Once | INTRAMUSCULAR | Status: AC
  Administered 2015-09-19: 10 mg via INTRAMUSCULAR

## 2015-09-19 NOTE — Discharge Instructions (Signed)
Take benadryl when you get home and rest

## 2015-09-19 NOTE — ED Provider Notes (Signed)
CSN: [id number]     Arrival date & time 09/19/15  1831 History   None    Chief Complaint  Patient presents with  . Headache   (Consider location/radiation/quality/duration/timing/severity/associated sxs/prior Treatment) Patient presents with worsening  Persistent nialteral temporal headache  X 2 weeks . Condition is acute in nature. Condition is made better by initially with OTC medication which no longer are effective. Patient endorses neck pain that is worse with movement and nausea. Patient denies any vomiting, nuchal rigidity, fevers, change in vision or trauma Patient reports a history of migraines   After 10 minutes of high flow o2 patient reports pain decrease from 9 to 7.        History reviewed. No pertinent past medical history. History reviewed. No pertinent surgical history. No family history on file. Social History  Substance Use Topics  . Smoking status: Never Smoker  . Smokeless tobacco: Never Used  . Alcohol use Yes     Comment: occa   OB History    No data available     Review of Systems  Constitutional: Negative.   Respiratory: Negative.   Cardiovascular: Negative.   Gastrointestinal: Negative.   Neurological: Positive for headaches (bilateral temperol lobes). Negative for seizures, syncope, facial asymmetry, speech difficulty, light-headedness and numbness.    Allergies  Toradol [ketorolac tromethamine]; Clindamycin/lincomycin; and Penicillins  Home Medications   Prior to Admission medications   Medication Sig Start Date End Date Taking? Authorizing Provider  doxycycline (VIBRA-TABS) 100 MG tablet Take 1 tablet (100 mg total) by mouth 2 (two) times daily. 03/17/13   Harden Mo, MD  HYDROcodone-acetaminophen (NORCO) 5-325 MG per tablet Take 1 tablet by mouth every 4 (four) hours as needed for pain. 01/31/12   Hazel Sams, PA-C  ibuprofen (ADVIL,MOTRIN) 200 MG tablet Take 600 mg by mouth every 6 (six) hours as needed (pain).    Historical Provider, MD   metroNIDAZOLE (FLAGYL) 500 MG tablet Take 1 tablet (500 mg total) by mouth 2 (two) times daily. 03/17/13   Harden Mo, MD  ondansetron (ZOFRAN) 4 MG tablet Take 1 tablet (4 mg total) by mouth every 6 (six) hours. 03/19/13   Junius Creamer, NP  oxyCODONE-acetaminophen (PERCOCET) 5-325 MG per tablet 1 to 2 tablets every 6 hours as needed for pain. 10/23/12   Harden Mo, MD   Meds Ordered and Administered this Visit   Medications  dexamethasone (DECADRON) injection 10 mg (10 mg Intramuscular Given 09/19/15 1951)  ondansetron (ZOFRAN) injection 4 mg (4 mg Intramuscular Given 09/19/15 1951)    BP 132/97 (BP Location: Left Arm)   Pulse 81   Temp 98.7 F (37.1 C) (Oral)   LMP 09/18/2015   SpO2 100%  No data found.   Physical Exam  Constitutional: She is oriented to person, place, and time. She appears well-developed and well-nourished.  Eyes: Pupils are equal, round, and reactive to light.  Neck: Normal range of motion. Neck supple.  Cardiovascular: Normal rate and regular rhythm.   Pulmonary/Chest: Effort normal and breath sounds normal.  Neurological: She is alert and oriented to person, place, and time.    Urgent Care Course   Clinical Course    Procedures (including critical care time)  Labs Review Labs Reviewed - No data to display  Imaging Review No results found.   Visual Acuity Review  Right Eye Distance:   Left Eye Distance:   Bilateral Distance:    Right Eye Near:   Left Eye Near:  Bilateral Near:         MDM   1. Nonintractable headache, unspecified chronicity pattern, unspecified headache type       Jacqualine Mau, NP 09/19/15 2000

## 2015-09-19 NOTE — ED Notes (Signed)
Placed on 15 L of O2 on a non re-breather mask per Anderson Malta, NP (VO)

## 2015-09-19 NOTE — ED Notes (Signed)
Pt d/c by Anderson Malta, NP

## 2015-09-19 NOTE — ED Notes (Signed)
Family at bedside. 

## 2015-09-19 NOTE — ED Notes (Signed)
Pt reports feeling a little bit better... Pain went down to 7 from 9... Arletha Grippe, NP

## 2015-09-19 NOTE — ED Triage Notes (Signed)
Pt here for constant HA onset 2 weeks... Reports pain today has been worse and OTC pain meds not helping anymore  Also reports neck pain that increases when she turns  HA increases w/BL and loud noise  A&O x4... NAD

## 2016-02-10 ENCOUNTER — Emergency Department (HOSPITAL_COMMUNITY): Payer: Medicaid Other

## 2016-02-10 ENCOUNTER — Emergency Department (HOSPITAL_COMMUNITY)
Admission: EM | Admit: 2016-02-10 | Discharge: 2016-02-11 | Disposition: A | Discharge status: Home / Self Care | Payer: Medicaid Other | Attending: Emergency Medicine | Admitting: Emergency Medicine

## 2016-02-10 ENCOUNTER — Encounter (HOSPITAL_COMMUNITY): Payer: Self-pay | Admitting: Emergency Medicine

## 2016-02-10 DIAGNOSIS — R079 Chest pain, unspecified: Secondary | ICD-10-CM | POA: Diagnosis present

## 2016-02-10 LAB — CBC
HEMATOCRIT: 38.2 % (ref 36.0–46.0)
Hemoglobin: 11.9 g/dL — ABNORMAL LOW (ref 12.0–15.0)
MCH: 26.2 pg (ref 26.0–34.0)
MCHC: 31.2 g/dL (ref 30.0–36.0)
MCV: 84.1 fL (ref 78.0–100.0)
PLATELETS: 415 10*3/uL — AB (ref 150–400)
RBC: 4.54 MIL/uL (ref 3.87–5.11)
RDW: 13.3 % (ref 11.5–15.5)
WBC: 5.3 10*3/uL (ref 4.0–10.5)

## 2016-02-10 LAB — BASIC METABOLIC PANEL
Anion gap: 8 (ref 5–15)
BUN: 9 mg/dL (ref 6–20)
CHLORIDE: 109 mmol/L (ref 101–111)
CO2: 23 mmol/L (ref 22–32)
CREATININE: 0.7 mg/dL (ref 0.44–1.00)
Calcium: 8.9 mg/dL (ref 8.9–10.3)
GFR calc Af Amer: >60 mL/min (ref >60)
GFR calc non Af Amer: >60 mL/min (ref >60)
Glucose, Bld: 94 mg/dL (ref 65–99)
POTASSIUM: 3.5 mmol/L (ref 3.5–5.1)
SODIUM: 140 mmol/L (ref 135–145)

## 2016-02-10 LAB — I-STAT TROPONIN, ED
TROPONIN I, POC: 0 ng/mL (ref 0.00–0.08)
Troponin i, poc: 0 ng/mL (ref 0.00–0.08)

## 2016-02-10 NOTE — ED Provider Notes (Signed)
Emergency Department Provider Note   I have reviewed the triage vital signs and the nursing notes.   HISTORY  Chief Complaint Chest Pain   HPI Amy Medina is a 41 y.o. female with no significant PMH or FH of ACS presents to the emergency department for evaluation of shoulder discomfort for the past 2 weeks with new onset left arm, chest, neck pain that started today. Patient states she was driving her car when severe symptoms started. She had a pressure in her left arm which seemed to then spread to her left chest and neck. No dyspnea. No diaphoresis. She had pain in the left shoulder for the past 2 weeks that is made worse with abducting her arm or putting on her coat. The worsening pain today did not feel like that discomfort. She's had no history of prior pain in her chest or arm. No injury to the arm. No exacerbating or alleviating factors. Patient's pain improved without intervention.   History reviewed. No pertinent past medical history.  There are no active problems to display for this patient.   History reviewed. No pertinent surgical history.  Current Outpatient Rx  . Order #: [id number] Class: Historical Med  . Order #: [id number] Class: Normal  . Order #: [id number] Class: Print  . Order #: [id number] Class: Normal  . Order #: [id number] Class: Print  . Order #: [id number] Class: Print    Allergies Toradol [ketorolac tromethamine]; Clindamycin/lincomycin; and Penicillins  No family history on file.  Social History Social History  Substance Use Topics  . Smoking status: Never Smoker  . Smokeless tobacco: Never Used  . Alcohol use Yes     Comment: occa    Review of Systems  Constitutional: No fever/chills Eyes: No visual changes. ENT: No sore throat. Cardiovascular: Positive chest pain. Respiratory: Denies shortness of breath. Gastrointestinal: No abdominal pain.  No nausea, no vomiting.  No diarrhea.  No constipation. Genitourinary: Negative for  dysuria. Musculoskeletal: Negative for back pain. Positive left shoulder/arm pain.  Skin: Negative for rash. Neurological: Negative for headaches, focal weakness or numbness.  10-point ROS otherwise negative.  ____________________________________________   PHYSICAL EXAM:  VITAL SIGNS: ED Triage Vitals  Enc Vitals Group     BP 02/10/16 1851 126/95     Pulse Rate 02/10/16 1851 81     Resp 02/10/16 1851 18     Temp 02/10/16 1851 97.8 F (36.6 C)     Temp Source 02/10/16 1851 Oral     SpO2 02/10/16 1851 100 %     Weight 02/10/16 1851 128 lb (58.1 kg)     Pain Score 02/10/16 1941 6   Constitutional: Alert and oriented. Well appearing and in no acute distress. Eyes: Conjunctivae are normal. Head: Atraumatic. Nose: No congestion/rhinnorhea. Mouth/Throat: Mucous membranes are moist.   Neck: No stridor.   Cardiovascular: Normal rate, regular rhythm. Good peripheral circulation. Grossly normal heart sounds.   Respiratory: Normal respiratory effort.  No retractions. Lungs CTAB. Gastrointestinal: Soft and nontender. No distention.  Musculoskeletal: No lower extremity tenderness nor edema. No gross deformities of extremities. Neurologic:  Normal speech and language. No gross focal neurologic deficits are appreciated.  Skin:  Skin is warm, dry and intact. No rash noted.  ____________________________________________   LABS (all labs ordered are listed, but only abnormal results are displayed)  Labs Reviewed  CBC - Abnormal; Notable for the following:       Result Value   Hemoglobin 11.9 (*)    Platelets 415 (*)    All other components  within normal limits  BASIC METABOLIC PANEL  I-STAT TROPOININ, ED  I-STAT TROPOININ, ED   ____________________________________________  EKG   EKG Interpretation  Date/Time:  Tuesday February 10 2016 19:47:29 EST Ventricular Rate:  78 PR Interval:    QRS Duration: 73 QT Interval:  339 QTC Calculation: 387 R Axis:   77 Text  Interpretation:  Sinus rhythm No STEMI.  Confirmed by Iona Stay MD, Dalia Jollie [phone]) on 02/10/2016 9:05:20 PM       ____________________________________________  RADIOLOGY  Dg Chest 2 View  Result Date: 02/10/2016 CLINICAL DATA:  41 year old female with left chest pain shortness of breath and dizziness today. Left shoulder pain for several days with no known injury. Initial encounter. EXAM: CHEST  2 VIEW COMPARISON:  Chest radiographs 03/10/2011. FINDINGS: Lung volumes remain normal. Normal cardiac size and mediastinal contours. Visualized tracheal air column is within normal limits. At the lungs are clear. No pneumothorax or pleural effusion. Negative visible bowel gas pattern. No osseous abnormality identified. IMPRESSION: Negative.  No acute cardiopulmonary abnormality. Electronically Signed   By: Genevie Ann M.D.   On: 02/10/2016 20:20    ____________________________________________   PROCEDURES  Procedure(s) performed:   Procedures  None ____________________________________________   INITIAL IMPRESSION / ASSESSMENT AND PLAN / ED COURSE  Pertinent labs & imaging results that were available during my care of the patient were reviewed by me and considered in my medical decision making (see chart for details).  Patient resents to the emergency room in for evaluation of chest pain, neck discomfort, left arm discomfort. Pain has since resolved. Patient is in no acute distress. Initial troponin is negative. Chest x-ray unremarkable. Normal EKG. Patient has few significant risk factors for ACS. HEART score is 1 with moderately suspicious story. Plan for serial troponin and reassessment.   12:01 AM Repeat troponin negative. Patient remains CP free. Will follow up with both PCP and Cardiology as outpatient.   At this time, I do not feel there is any life-threatening condition present. I have reviewed and discussed all results (EKG, imaging, lab, urine as appropriate), exam findings with patient.  I have reviewed nursing notes and appropriate previous records.  I feel the patient is safe to be discharged home without further emergent workup. Discussed usual and customary return precautions. Patient and family (if present) verbalize understanding and are comfortable with this plan.  Patient will follow-up with their primary care provider. If they do not have a primary care provider, information for follow-up has been provided to them. All questions have been answered.  ____________________________________________  FINAL CLINICAL IMPRESSION(S) / ED DIAGNOSES  Final diagnoses:  Nonspecific chest pain     MEDICATIONS GIVEN DURING THIS VISIT:  None  NEW OUTPATIENT MEDICATIONS STARTED DURING THIS VISIT:  None    Note:  This document was prepared using Dragon voice recognition software and may include unintentional dictation errors.  Nanda Quinton, MD Emergency Medicine   Margette Fast, MD 02/11/16 [phone]

## 2016-02-10 NOTE — ED Triage Notes (Signed)
Pt reports left-sided chest pain that began about 3 hours ago; reports left arm pain that radiates up neck as well; said she was SOB before but that has resolved; denies N/V, weakness, and diaphoresis; ambulatory in triage

## 2016-02-11 NOTE — Discharge Instructions (Signed)

## 2016-06-14 ENCOUNTER — Ambulatory Visit (HOSPITAL_COMMUNITY)
Admission: EM | Admit: 2016-06-14 | Discharge: 2016-06-14 | Disposition: A | Discharge status: Home / Self Care | Payer: Medicaid Other | Attending: Family Medicine | Admitting: Family Medicine

## 2016-06-14 ENCOUNTER — Encounter (HOSPITAL_COMMUNITY): Payer: Self-pay | Admitting: Emergency Medicine

## 2016-06-14 DIAGNOSIS — M79602 Pain in left arm: Secondary | ICD-10-CM

## 2016-06-14 DIAGNOSIS — M7542 Impingement syndrome of left shoulder: Secondary | ICD-10-CM

## 2016-06-14 DIAGNOSIS — M67912 Unspecified disorder of synovium and tendon, left shoulder: Secondary | ICD-10-CM

## 2016-06-14 MED ORDER — NAPROXEN 375 MG PO TABS: 375.0000 mg | ORAL_TABLET | Freq: Two times a day (BID) | ORAL | 0 refills | Status: AC

## 2016-06-14 NOTE — Discharge Instructions (Signed)
You may move his shoulder in a way that does not aggravate the pain. Apply ice to the shoulder joint. Take the medication for inflammation and follow-up with the orthopedist listed on this page.

## 2016-06-14 NOTE — ED Triage Notes (Signed)
Left arm pain for 2 weeks, no known injury.  Patient is right handed.  Pain in left neck and into left arm.  Able to move fingers, no numbness or tingling.  Radial pulse is 2 +.  Pain seems worse in the morning and late at night
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
## 2019-06-07 ENCOUNTER — Emergency Department (HOSPITAL_BASED_OUTPATIENT_CLINIC_OR_DEPARTMENT_OTHER)
Admission: EM | Admit: 2019-06-07 | Discharge: 2019-06-07 | Disposition: A | Discharge status: Home / Self Care | Payer: Self-pay | Attending: Emergency Medicine | Admitting: Emergency Medicine

## 2019-06-07 ENCOUNTER — Other Ambulatory Visit: Payer: Self-pay

## 2019-06-07 ENCOUNTER — Emergency Department (HOSPITAL_BASED_OUTPATIENT_CLINIC_OR_DEPARTMENT_OTHER): Payer: Self-pay

## 2019-06-07 ENCOUNTER — Encounter (HOSPITAL_BASED_OUTPATIENT_CLINIC_OR_DEPARTMENT_OTHER): Payer: Self-pay

## 2019-06-07 ENCOUNTER — Emergency Department (HOSPITAL_COMMUNITY): Admission: EM | Admit: 2019-06-07 | Discharge: 2019-06-07 | Discharge status: Against Medical Advice | Payer: Self-pay

## 2019-06-07 DIAGNOSIS — Z79899 Other long term (current) drug therapy: Secondary | ICD-10-CM | POA: Insufficient documentation

## 2019-06-07 DIAGNOSIS — N939 Abnormal uterine and vaginal bleeding, unspecified: Secondary | ICD-10-CM

## 2019-06-07 DIAGNOSIS — D259 Leiomyoma of uterus, unspecified: Secondary | ICD-10-CM | POA: Insufficient documentation

## 2019-06-07 LAB — COMPREHENSIVE METABOLIC PANEL
ALT: 14 U/L (ref 0–44)
AST: 26 U/L (ref 15–41)
Albumin: 4.1 g/dL (ref 3.5–5.0)
Alkaline Phosphatase: 36 U/L — ABNORMAL LOW (ref 38–126)
Anion gap: 10 (ref 5–15)
BUN: 8 mg/dL (ref 6–20)
CO2: 21 mmol/L — ABNORMAL LOW (ref 22–32)
Calcium: 8.3 mg/dL — ABNORMAL LOW (ref 8.9–10.3)
Chloride: 106 mmol/L (ref 98–111)
Creatinine, Ser: 0.75 mg/dL (ref 0.44–1.00)
GFR calc Af Amer: >60 mL/min (ref >60)
GFR calc non Af Amer: >60 mL/min (ref >60)
Glucose, Bld: 91 mg/dL (ref 70–99)
Potassium: 3.8 mmol/L (ref 3.5–5.1)
Sodium: 137 mmol/L (ref 135–145)
Total Bilirubin: 0.5 mg/dL (ref 0.3–1.2)
Total Protein: 7.9 g/dL (ref 6.5–8.1)

## 2019-06-07 LAB — CBC WITH DIFFERENTIAL/PLATELET
Abs Immature Granulocytes: 0.02 10*3/uL (ref 0.00–0.07)
Basophils Absolute: 0 10*3/uL (ref 0.0–0.1)
Basophils Relative: 0 %
Eosinophils Absolute: 0.1 10*3/uL (ref 0.0–0.5)
Eosinophils Relative: 1 %
HCT: 36.7 % (ref 36.0–46.0)
Hemoglobin: 10.7 g/dL — ABNORMAL LOW (ref 12.0–15.0)
Immature Granulocytes: 0 %
Lymphocytes Relative: 28 %
Lymphs Abs: 2 10*3/uL (ref 0.7–4.0)
MCH: 23.5 pg — ABNORMAL LOW (ref 26.0–34.0)
MCHC: 29.2 g/dL — ABNORMAL LOW (ref 30.0–36.0)
MCV: 80.5 fL (ref 80.0–100.0)
Monocytes Absolute: 0.6 10*3/uL (ref 0.1–1.0)
Monocytes Relative: 8 %
Neutro Abs: 4.6 10*3/uL (ref 1.7–7.7)
Neutrophils Relative %: 63 %
Platelets: 510 10*3/uL — ABNORMAL HIGH (ref 150–400)
RBC: 4.56 MIL/uL (ref 3.87–5.11)
RDW: 15.2 % (ref 11.5–15.5)
WBC: 7.3 10*3/uL (ref 4.0–10.5)
nRBC: 0 % (ref 0.0–0.2)

## 2019-06-07 LAB — WET PREP, GENITAL
Clue Cells Wet Prep HPF POC: NONE SEEN
Sperm: NONE SEEN
Trich, Wet Prep: NONE SEEN
Yeast Wet Prep HPF POC: NONE SEEN

## 2019-06-07 LAB — HCG, QUANTITATIVE, PREGNANCY: hCG, Beta Chain, Quant, S: 1 m[IU]/mL (ref <5)

## 2019-06-07 NOTE — ED Notes (Signed)
ED Provider at bedside. 

## 2019-06-07 NOTE — Discharge Instructions (Signed)
Return if any problems.  Schedule appointment at the clinic for evaluation

## 2019-06-07 NOTE — ED Triage Notes (Signed)
Pt arrives ambulatory to ED reports that she lifted a 5 gallon can about 2 days ago and states that she starting having vaginal bleeding reports last menstrual one week ago, states that the bleeding has become heavier. Pt reports lower abdominal "pop" and pain when lifting object.

## 2019-06-07 NOTE — ED Notes (Signed)
Pt ambulated back to room from [place].

## 2019-06-07 NOTE — ED Provider Notes (Addendum)
[facility] EMERGENCY DEPARTMENT Provider Note   CSN: [id number] Arrival date & time: 06/07/19  [id number]     History Chief Complaint  Patient presents with  . Vaginal Bleeding    Amy Medina is a 44 y.o. female.  The history is provided by the patient. No language interpreter was used.  Vaginal Bleeding Quality:  Bright red Severity:  Moderate Onset quality:  Gradual Duration:  2 days Timing:  Constant Progression:  Worsening      History reviewed. No pertinent past medical history.  There are no problems to display for this patient.   History reviewed. No pertinent surgical history.   OB History   No obstetric history on file.     No family history on file.  Social History   Tobacco Use  . Smoking status: Never Smoker  . Smokeless tobacco: Never Used  Substance Use Topics  . Alcohol use: Yes    Comment: occa  . Drug use: No    Home Medications Prior to Admission medications   Medication Sig Start Date End Date Taking? Authorizing Provider  ibuprofen (ADVIL,MOTRIN) 200 MG tablet Take 600 mg by mouth every 6 (six) hours as needed (pain).    [provider]  naproxen (NAPROSYN) 375 MG tablet Take 1 tablet (375 mg total) by mouth 2 (two) times daily. 06/14/16   Janne Napoleon, NP    Allergies    Toradol [ketorolac tromethamine], Clindamycin/lincomycin, and Penicillins  Review of Systems   Review of Systems  Genitourinary: Positive for vaginal bleeding.  All other systems reviewed and are negative.   Physical Exam Updated Vital Signs BP 123/82 (BP Location: Left Arm)   Pulse 72   Temp 98.2 F (36.8 C) (Oral)   Resp 16   Ht 5' (1.524 m)   Wt 58.1 kg   LMP 05/31/2019   SpO2 100%   BMI 25.00 kg/m   Physical Exam Vitals reviewed.  HENT:     Head: Normocephalic.     Right Ear: Tympanic membrane normal.     Left Ear: Tympanic membrane normal.     Nose: Nose normal.     Mouth/Throat:     Mouth: Mucous membranes are  moist.  Cardiovascular:     Rate and Rhythm: Normal rate.  Pulmonary:     Effort: Pulmonary effort is normal.  Abdominal:     General: Abdomen is flat.  Genitourinary:    Vagina: Vaginal discharge present.     Comments: Moderate vaginal bleeding, enlarged uterus,  Musculoskeletal:        General: Normal range of motion.     Cervical back: Normal range of motion.  Skin:    General: Skin is warm.  Neurological:     General: No focal deficit present.     Mental Status: She is alert.  Psychiatric:        Mood and Affect: Mood normal.     ED Results / Procedures / Treatments   Labs (all labs ordered are listed, but only abnormal results are displayed) Labs Reviewed  WET PREP, GENITAL - Abnormal; Notable for the following components:      Result Value   WBC, Wet Prep HPF POC FEW (*)    All other components within normal limits  CBC WITH DIFFERENTIAL/PLATELET - Abnormal; Notable for the following components:   Hemoglobin 10.7 (*)    MCH 23.5 (*)    MCHC 29.2 (*)    Platelets 510 (*)    All other components within  normal limits  COMPREHENSIVE METABOLIC PANEL - Abnormal; Notable for the following components:   CO2 21 (*)    Calcium 8.3 (*)    Alkaline Phosphatase 36 (*)    All other components within normal limits  HCG, QUANTITATIVE, PREGNANCY  GC/CHLAMYDIA PROBE AMP ([place]) NOT AT [facility]    EKG None  Radiology US PELVIC COMPLETE WITH TRANSVAGINAL  Result Date: 06/07/2019 CLINICAL DATA:  Pelvic pain, vaginal bleeding EXAM: TRANSABDOMINAL AND TRANSVAGINAL ULTRASOUND OF PELVIS TECHNIQUE: Both transabdominal and transvaginal ultrasound examinations of the pelvis were performed. Transabdominal technique was performed for global imaging of the pelvis including uterus, ovaries, adnexal regions, and pelvic cul-de-sac. It was necessary to proceed with endovaginal exam following the transabdominal exam to visualize the endometrium. COMPARISON:  None FINDINGS: Uterus  Measurements: 9.8 x 7.1 x 8.7 cm = volume: 320 mL. There are multiple heterogeneously hypoechoic uterine masses, the largest located anteriorly measuring up to 4.6 cm. There are at least 3 additional fibroids ranging in size from 1.8 to 3.0 cm. Endometrium Thickness: 4 mm. No focal abnormality visualized, although full evaluation is partially obscured by adjacent fibroids. Right ovary Measurements: 3.4 x 1.3 x 1.5 cm = volume: 3 mL. Normal transabdominal appearance. No adnexal mass. Left ovary Measurements: 3.2 x 2.4 x 2.2 cm = volume: 9 mL. Normal transabdominal appearance. No adnexal mass. Other findings No abnormal free fluid. IMPRESSION: 1. Multifibroid uterus.  Largest fibroid measures up to 4.6 cm. 2. Visualized portion of the endometrium measured 4 mm in thickness and appeared unremarkable. It is unclear whether the adjacent fibroids have a submucosal component. 3. Unremarkable transabdominal appearance of the bilateral ovaries. Electronically Signed   By: Davina Poke D.O.   On: 06/07/2019 12:26    Procedures Procedures (including critical care time)  Medications Ordered in ED Medications - No data to display  ED Course  I have reviewed the triage vital signs and the nursing notes.  Pertinent labs & imaging results that were available during my care of the patient were reviewed by me and considered in my medical decision making (see chart for details).    MDM Rules/Calculators/A&P                      Ultrasound shows fibroids.  I counseled pt.  Pt advised to schedule appointment with gynecology for evaltuion.  Final Clinical Impression(s) / ED Diagnoses Final diagnoses:  Uterine leiomyoma, unspecified location  Abnormal uterine bleeding (AUB)    Rx / DC Orders ED Discharge Orders    None    An After Visit Summary was printed and given to the patient.    Fransico Meadow, PA-C 06/07/19 1315    Fransico Meadow, [place] 06/07/19 1315    Blanchie Dessert, MD 06/07/19  1331

## 2019-06-08 LAB — GC/CHLAMYDIA PROBE AMP (~~LOC~~) NOT AT ARMC
Chlamydia: NEGATIVE
Comment: NEGATIVE
Comment: NORMAL
Neisseria Gonorrhea: NEGATIVE

## 2020-11-24 ENCOUNTER — Other Ambulatory Visit: Payer: Self-pay

## 2020-11-24 ENCOUNTER — Emergency Department (HOSPITAL_BASED_OUTPATIENT_CLINIC_OR_DEPARTMENT_OTHER)
Admission: EM | Admit: 2020-11-24 | Discharge: 2020-11-25 | Disposition: A | Discharge status: Home / Self Care | Payer: Medicaid Other | Attending: Emergency Medicine | Admitting: Emergency Medicine

## 2020-11-24 ENCOUNTER — Emergency Department (HOSPITAL_BASED_OUTPATIENT_CLINIC_OR_DEPARTMENT_OTHER): Payer: Medicaid Other

## 2020-11-24 DIAGNOSIS — R519 Headache, unspecified: Secondary | ICD-10-CM | POA: Insufficient documentation

## 2020-11-24 DIAGNOSIS — M542 Cervicalgia: Secondary | ICD-10-CM | POA: Insufficient documentation

## 2020-11-24 MED ORDER — ACETAMINOPHEN 500 MG PO TABS
1000.0000 mg | ORAL_TABLET | Freq: Once | ORAL | Status: AC
  Administered 2020-11-25: 1000 mg via ORAL
  Filled 2020-11-24: qty 2

## 2020-11-24 MED ORDER — LIDOCAINE 5 % EX PTCH
1.0000 | MEDICATED_PATCH | CUTANEOUS | Status: DC
  Administered 2020-11-25: 1 via TRANSDERMAL
  Filled 2020-11-24: qty 1

## 2020-11-24 NOTE — ED Triage Notes (Signed)
Pt via pov from home with headache and ear/neck pain x 2 weeks. Pt states it began as neck and ear pain and that her head began hurting afterwards; pain is in the posterior, more so in the left. Pt denies any injury or trauma. Pt also has left ear pain. Pt has been taking advil, which decreases the pain, but it returns. Pt alert & oriented, nad noted.

## 2020-11-25 ENCOUNTER — Encounter (HOSPITAL_BASED_OUTPATIENT_CLINIC_OR_DEPARTMENT_OTHER): Payer: Self-pay | Admitting: Emergency Medicine

## 2020-11-25 MED ORDER — DEXAMETHASONE SODIUM PHOSPHATE 4 MG/ML IJ SOLN
4.0000 mg | Freq: Once | INTRAMUSCULAR | Status: AC
  Administered 2020-11-25: 4 mg via INTRAMUSCULAR
  Filled 2020-11-25: qty 1

## 2020-11-25 MED ORDER — LIDOCAINE 5 % EX PTCH: 1.0000 | MEDICATED_PATCH | CUTANEOUS | 0 refills | Status: AC

## 2020-11-25 NOTE — ED Provider Notes (Signed)
[facility] EMERGENCY DEPT Provider Note   CSN: [id number] Arrival date & time: 11/24/20  2028     History Chief Complaint  Patient presents with   Headache    Amy Medina is a 45 y.o. female.  The history is provided by the patient.  Headache Location: where SCM on left meets head. Severity currently:  9/10 Severity at highest:  9/10 Onset quality:  Gradual Duration:  2 weeks Timing:  Constant Progression:  Unchanged Chronicity:  New Context: not eating and not intercourse   Relieved by:  Nothing Worsened by:  Nothing Ineffective treatments:  None tried Associated symptoms: neck pain   Associated symptoms: no abdominal pain, no back pain, no blurred vision, no congestion, no cough, no diarrhea, no dizziness, no eye pain, no facial pain, no fatigue, no fever, no focal weakness, no hearing loss, no neck stiffness, no numbness, no paresthesias, no photophobia, no sore throat, no swollen glands, no syncope, no URI and no visual change       History reviewed. No pertinent past medical history.  There are no problems to display for this patient.   History reviewed. No pertinent surgical history.   OB History   No obstetric history on file.     History reviewed. No pertinent family history.  Social History   Tobacco Use   Smoking status: Never   Smokeless tobacco: Never  Vaping Use   Vaping Use: Never used  Substance Use Topics   Alcohol use: Yes    Comment: occa   Drug use: No    Home Medications Prior to Admission medications   Medication Sig Start Date End Date Taking? Authorizing Provider  ibuprofen (ADVIL,MOTRIN) 200 MG tablet Take 600 mg by mouth every 6 (six) hours as needed (pain).    [provider]  naproxen (NAPROSYN) 375 MG tablet Take 1 tablet (375 mg total) by mouth 2 (two) times daily. 06/14/16   Janne Napoleon, NP    Allergies    Toradol [ketorolac tromethamine], Clindamycin/lincomycin, and Penicillins  Review  of Systems   Review of Systems  Constitutional:  Negative for fatigue and fever.  HENT:  Negative for congestion, hearing loss and sore throat.   Eyes:  Negative for blurred vision, photophobia and pain.  Respiratory:  Negative for cough.   Cardiovascular:  Negative for chest pain and syncope.  Gastrointestinal:  Negative for abdominal pain and diarrhea.  Genitourinary:  Negative for difficulty urinating.  Musculoskeletal:  Positive for neck pain. Negative for back pain and neck stiffness.  Skin:  Negative for rash.  Neurological:  Positive for headaches. Negative for dizziness, focal weakness, facial asymmetry, numbness and paresthesias.  Psychiatric/Behavioral:  Negative for agitation.   All other systems reviewed and are negative.  Physical Exam Updated Vital Signs BP (!) 144/97   Pulse 74   Temp 98.3 F (36.8 C) (Oral)   Resp 18   Ht 5' (1.524 m)   Wt 59.9 kg   LMP 11/16/2020 (Approximate)   SpO2 100%   BMI 25.78 kg/m   Physical Exam Vitals and nursing note reviewed.  Constitutional:      General: She is not in acute distress.    Appearance: Normal appearance.  HENT:     Head: Normocephalic and atraumatic.     Right Ear: Tympanic membrane normal.     Left Ear: Tympanic membrane normal.     Nose: Nose normal.     Mouth/Throat:     Mouth: Mucous membranes are moist.  Eyes:  Extraocular Movements: Extraocular movements intact.     Conjunctiva/sclera: Conjunctivae normal.     Pupils: Pupils are equal, round, and reactive to light.  Neck:     Vascular: No carotid bruit.  Cardiovascular:     Rate and Rhythm: Normal rate and regular rhythm.     Pulses: Normal pulses.     Heart sounds: Normal heart sounds.  Pulmonary:     Effort: Pulmonary effort is normal.     Breath sounds: Normal breath sounds.  Abdominal:     General: Abdomen is flat. Bowel sounds are normal.     Palpations: Abdomen is soft.     Tenderness: There is no abdominal tenderness. There is no  guarding.  Musculoskeletal:        General: Normal range of motion.     Cervical back: Normal range of motion and neck supple. No rigidity or tenderness.  Lymphadenopathy:     Cervical: No cervical adenopathy.  Skin:    General: Skin is warm and dry.     Capillary Refill: Capillary refill takes less than 2 seconds.  Neurological:     General: No focal deficit present.     Mental Status: She is alert and oriented to person, place, and time.     Cranial Nerves: No cranial nerve deficit.     Deep Tendon Reflexes: Reflexes normal.  Psychiatric:        Mood and Affect: Mood normal.        Behavior: Behavior normal.    ED Results / Procedures / Treatments   Labs (all labs ordered are listed, but only abnormal results are displayed) Labs Reviewed - No data to display  EKG None  Radiology CT Head Wo Contrast  Result Date: 11/24/2020 CLINICAL DATA:  Trauma. EXAM: CT HEAD WITHOUT CONTRAST CT CERVICAL SPINE WITHOUT CONTRAST TECHNIQUE: Multidetector CT imaging of the head and cervical spine was performed following the standard protocol without intravenous contrast. Multiplanar CT image reconstructions of the cervical spine were also generated. COMPARISON:  None. FINDINGS: CT HEAD FINDINGS Brain: No evidence of acute infarction, hemorrhage, hydrocephalus, extra-axial collection or mass lesion/mass effect. Vascular: No hyperdense vessel or unexpected calcification. Skull: Normal. Negative for fracture or focal lesion. Sinuses/Orbits: No acute finding. Other: None CT CERVICAL SPINE FINDINGS Alignment: There is reversal of normal cervical lordosis which may be positional or due to muscle spasm. No acute subluxation. Skull base and vertebrae: No acute fracture. No primary bone lesion or focal pathologic process. Soft tissues and spinal canal: No prevertebral fluid or swelling. No visible canal hematoma. Disc levels:  Mild degenerative changes. Upper chest: Negative. Other: None IMPRESSION: 1. Normal  noncontrast CT of the brain. 2. No acute/traumatic cervical spine pathology. Electronically Signed   By: Anner Crete M.D.   On: 11/24/2020 23:50   CT Cervical Spine Wo Contrast  Result Date: 11/24/2020 CLINICAL DATA:  Trauma. EXAM: CT HEAD WITHOUT CONTRAST CT CERVICAL SPINE WITHOUT CONTRAST TECHNIQUE: Multidetector CT imaging of the head and cervical spine was performed following the standard protocol without intravenous contrast. Multiplanar CT image reconstructions of the cervical spine were also generated. COMPARISON:  None. FINDINGS: CT HEAD FINDINGS Brain: No evidence of acute infarction, hemorrhage, hydrocephalus, extra-axial collection or mass lesion/mass effect. Vascular: No hyperdense vessel or unexpected calcification. Skull: Normal. Negative for fracture or focal lesion. Sinuses/Orbits: No acute finding. Other: None CT CERVICAL SPINE FINDINGS Alignment: There is reversal of normal cervical lordosis which may be positional or due to muscle spasm. No acute subluxation.  Skull base and vertebrae: No acute fracture. No primary bone lesion or focal pathologic process. Soft tissues and spinal canal: No prevertebral fluid or swelling. No visible canal hematoma. Disc levels:  Mild degenerative changes. Upper chest: Negative. Other: None IMPRESSION: 1. Normal noncontrast CT of the brain. 2. No acute/traumatic cervical spine pathology. Electronically Signed   By: Anner Crete M.D.   On: 11/24/2020 23:50    Procedures Procedures   Medications Ordered in ED Medications  lidocaine (LIDODERM) 5 % 1 patch (1 patch Transdermal Patch Applied 11/25/20 0014)  acetaminophen (TYLENOL) tablet 1,000 mg (1,000 mg Oral Given 11/25/20 0013)  dexamethasone (DECADRON) injection 4 mg (4 mg Intramuscular Given 11/25/20 0014)    ED Course  I have reviewed the triage vital signs and the nursing notes.  Pertinent labs & imaging results that were available during my care of the patient were reviewed by me and  considered in my medical decision making (see chart for details).   Pain along SCM.  Likely MSK, imaging and exam are benign and reassuring.    Amy Medina was evaluated in Emergency Department on 11/25/2020 for the symptoms described in the history of present illness. She was evaluated in the context of the global COVID-19 pandemic, which necessitated consideration that the patient might be at risk for infection with the SARS-CoV-2 virus that causes COVID-19. Institutional protocols and algorithms that pertain to the evaluation of patients at risk for COVID-19 are in a state of rapid change based on information released by regulatory bodies including the CDC and federal and state organizations. These policies and algorithms were followed during the patient's care in the ED.  Final Clinical Impression(s) / ED Diagnoses Final diagnoses:  None   Return for intractable cough, coughing up blood, fevers > 100.4 unrelieved by medication, shortness of breath, intractable vomiting, chest pain, shortness of breath, weakness, numbness, changes in speech, facial asymmetry, abdominal pain, passing out, Inability to tolerate liquids or food, cough, altered mental status or any concerns. No signs of systemic illness or infection. The patient is nontoxic-appearing on exam and vital signs are within normal limits.  I have reviewed the triage vital signs and the nursing notes. Pertinent labs & imaging results that were available during my care of the patient were reviewed by me and considered in my medical decision making (see chart for details). After history, exam, and medical workup I feel the patient has been appropriately medically screened and is safe for discharge home. Pertinent diagnoses were discussed with the patient. Patient was given return precautions.  Rx / DC Orders ED Discharge Orders     None        Garrett Mitchum, MD 11/25/20 [phone]

## 2021-11-18 ENCOUNTER — Encounter (HOSPITAL_COMMUNITY): Payer: Self-pay | Admitting: Emergency Medicine

## 2021-11-18 ENCOUNTER — Ambulatory Visit (HOSPITAL_COMMUNITY)
Admission: EM | Admit: 2021-11-18 | Discharge: 2021-11-18 | Disposition: A | Discharge status: Home / Self Care | Payer: Self-pay | Attending: Family Medicine | Admitting: Family Medicine

## 2021-11-18 DIAGNOSIS — J0110 Acute frontal sinusitis, unspecified: Secondary | ICD-10-CM

## 2021-11-18 DIAGNOSIS — R051 Acute cough: Secondary | ICD-10-CM

## 2021-11-18 LAB — POCT RAPID STREP A, ED / UC: Streptococcus, Group A Screen (Direct): NEGATIVE

## 2021-11-18 MED ORDER — FLUCONAZOLE 150 MG PO TABS: ORAL_TABLET | ORAL | 0 refills | Status: AC

## 2021-11-18 MED ORDER — PROMETHAZINE-DM 6.25-15 MG/5ML PO SYRP: 5.0000 mL | ORAL_SOLUTION | Freq: Four times a day (QID) | ORAL | 0 refills | Status: AC | PRN

## 2021-11-18 MED ORDER — CEFDINIR 300 MG PO CAPS: 300.0000 mg | ORAL_CAPSULE | Freq: Two times a day (BID) | ORAL | 0 refills | Status: AC

## 2021-11-18 NOTE — ED Triage Notes (Addendum)
Pt reports a sore throat since last Tuesday. States has been taking 6 different OTC medications.  Also adds a "strong" cough x 3 days.

## 2021-11-19 NOTE — ED Provider Notes (Signed)
[place]   [id number] 11/18/21 Arrival Time: 1925  ASSESSMENT & PLAN:  1. Acute non-recurrent frontal sinusitis   2. Acute cough     Meds ordered this encounter  Medications   promethazine-dextromethorphan (PROMETHAZINE-DM) 6.25-15 MG/5ML syrup    Sig: Take 5 mLs by mouth 4 (four) times daily as needed for cough.    Dispense:  118 mL    Refill:  0   cefdinir (OMNICEF) 300 MG capsule    Sig: Take 1 capsule (300 mg total) by mouth 2 (two) times daily.    Dispense:  20 capsule    Refill:  0   fluconazole (DIFLUCAN) 150 MG tablet    Sig: Take one tablet by mouth as a single dose. May repeat in 3 days if symptoms persist.    Dispense:  2 tablet    Refill:  0   OTC symptom care as needed. Ensure adequate fluid intake and rest.   Follow-up Information     [place], [facility].   Specialty: Internal Medicine Why: If worsening or failing to improve as anticipated. Contact information: [place] [address] [phone]                 Reviewed expectations re: course of current medical issues. Questions answered. Outlined signs and symptoms indicating need for more acute intervention. Patient verbalized understanding. After Visit Summary given.   SUBJECTIVE: History from: patient.  Amy Medina is a 46 y.o. female who presents with complaint of nasal congestion, post-nasal drainage, and sinus pain. Onset gradual,  > 1 week . Respiratory symptoms: none. Fever: absent. Overall normal PO intake without n/v. OTC treatment: various without relief.  History of frequent sinus infections: no. No specific aggravating or alleviating factors reported.  Social History   Tobacco Use  Smoking Status Never  Smokeless Tobacco Never    OBJECTIVE:  Vitals:   11/18/21 1945  BP: (!) 138/106  Pulse: 92  Resp: 18  Temp: 98.2 F (36.8 C)  TempSrc: Oral  SpO2: 100%    General appearance: alert; no distress HEENT:  nasal congestion; clear runny nose; throat irritation secondary to post-nasal drainage; frontal tenderness to palpation; turbinates boggy Neck: supple without LAD; trachea midline Lungs: unlabored respirations, symmetrical air entry; cough: absent; no respiratory distress Skin: warm and dry Psychological: alert and cooperative; normal mood and affect  Allergies  Allergen Reactions   Toradol [Ketorolac Tromethamine] Shortness Of Breath   Clindamycin/Lincomycin Hives   Penicillins     Reaction=unknown    History reviewed. No pertinent past medical history. History reviewed. No pertinent family history. Social History   Socioeconomic History   Marital status: Single    Spouse name: Not on file   Number of children: Not on file   Years of education: Not on file   Highest education level: Not on file  Occupational History   Not on file  Tobacco Use   Smoking status: Never   Smokeless tobacco: Never  Vaping Use   Vaping Use: Never used  Substance and Sexual Activity   Alcohol use: Yes    Comment: occa   Drug use: No   Sexual activity: Yes    Birth control/protection: Condom  Other Topics Concern   Not on file  Social History Narrative   Not on file   Social Determinants of Health   Financial Resource Strain: Not on file  Food Insecurity: Not on file  Transportation Needs: Not on file  Physical  Activity: Not on file  Stress: Not on file  Social Connections: Not on file  Intimate Partner Violence: Not on file             Vanessa Kick, MD 11/19/21 [phone]

## 2022-03-29 ENCOUNTER — Ambulatory Visit: Payer: Medicaid Other | Admitting: Obstetrics and Gynecology

## 2022-05-17 ENCOUNTER — Encounter: Payer: Medicaid Other | Admitting: Obstetrics and Gynecology

## 2022-11-14 IMAGING — CT CT HEAD W/O CM
4 series · 17 of 47 positions shown, 19 images · non-contrast
Comparison: None.

CLINICAL DATA: Trauma.

EXAM:
CT HEAD WITHOUT CONTRAST
CT CERVICAL SPINE WITHOUT CONTRAST
TECHNIQUE: Multidetector CT imaging of the head and cervical spine was
performed following the standard protocol without intravenous
contrast. Multiplanar CT image reconstructions of the cervical spine
were also generated.

[Series 2: head wo · axial · 0.45mm/px · z∈[+1232,+1352]mm · 7 of 32 slices shown, 9 images]
[im 4/32  brain]
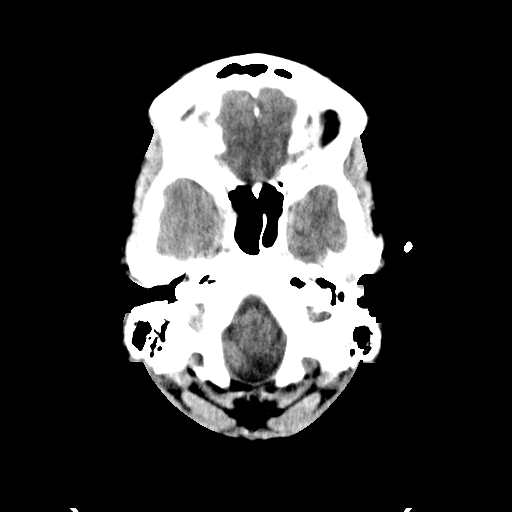
[im 4/32  bone]
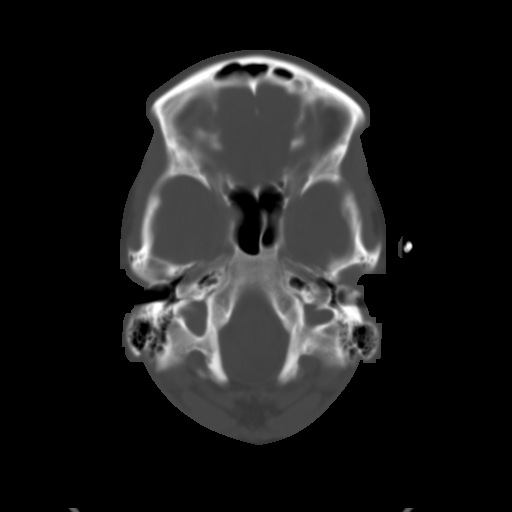
[im 8/32  brain]
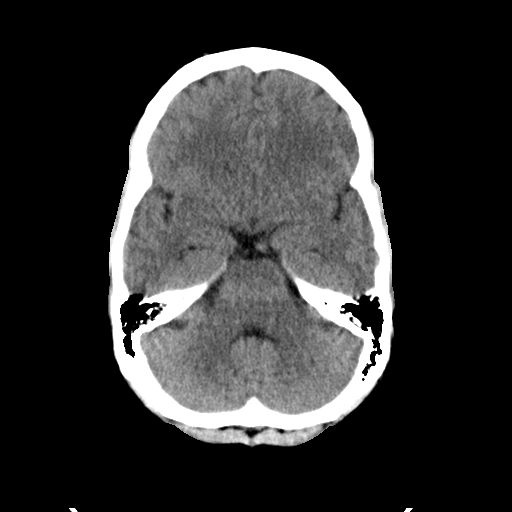
[im 12/32  brain]
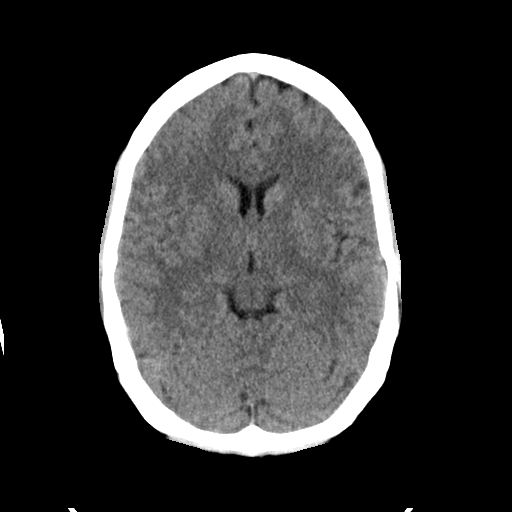
[im 16/32  brain]
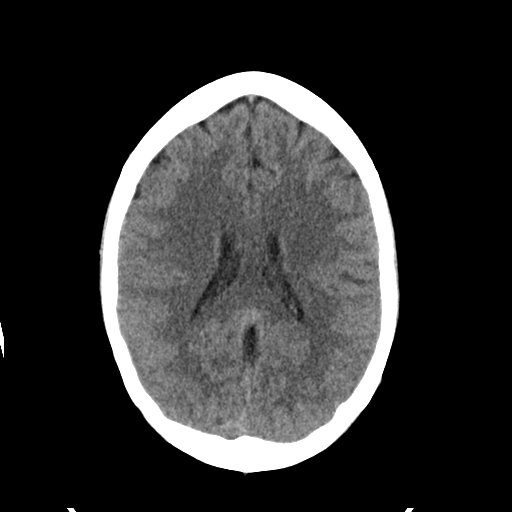
[im 20/32  brain]
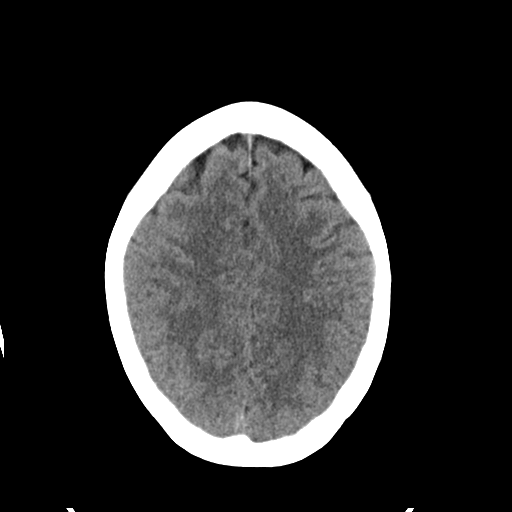
[im 20/32  bone]
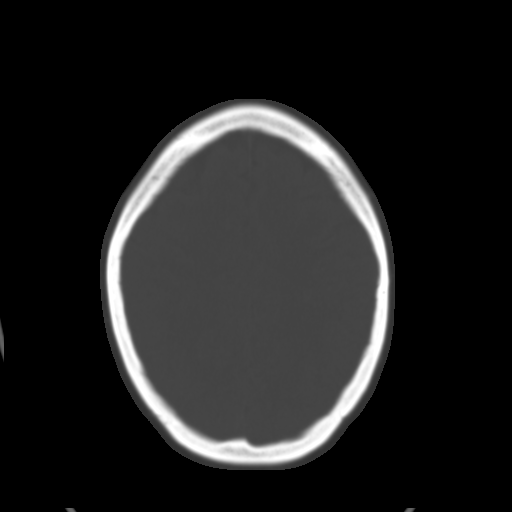
[im 24/32  brain]
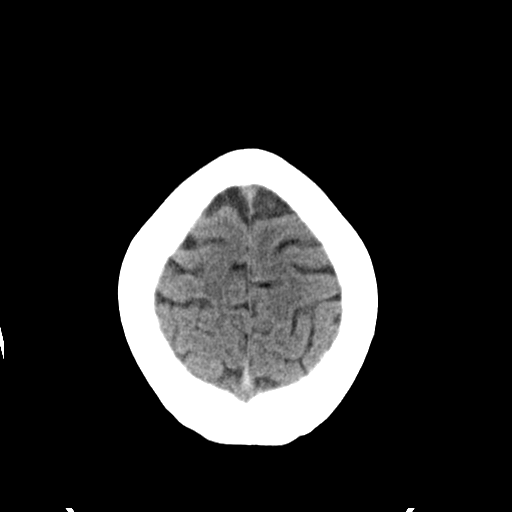
[im 28/32  brain]
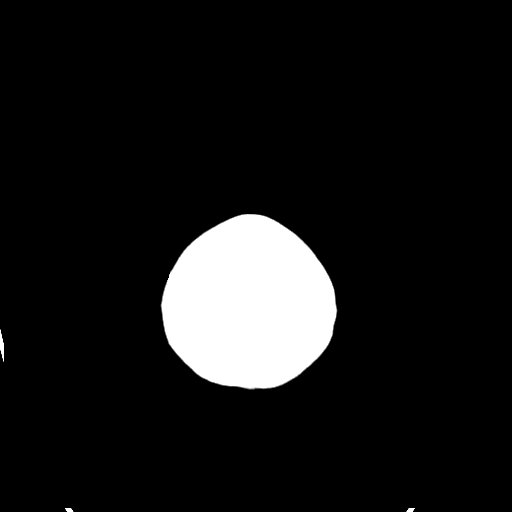

[Series 3: head bone · axial · 0.45mm/px · z∈[+1231,+1285]mm · 4 of 78 slices shown]
[im 8/78  bone]
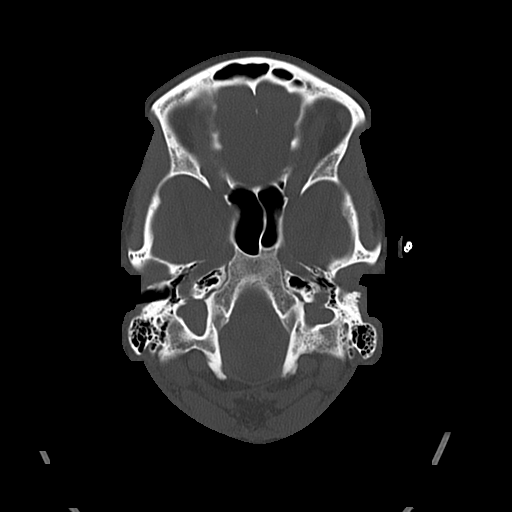
[im 16/78  bone]
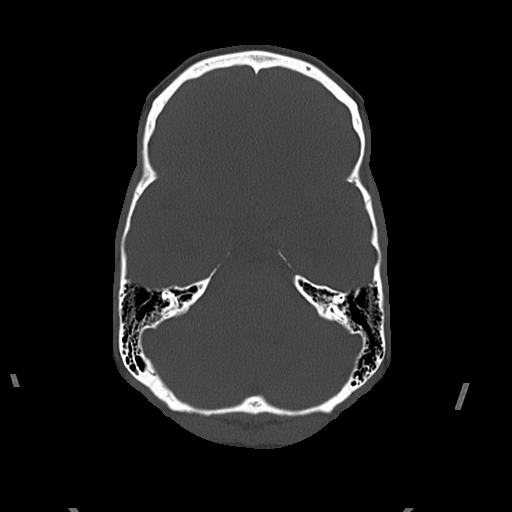
[im 24/78  bone]
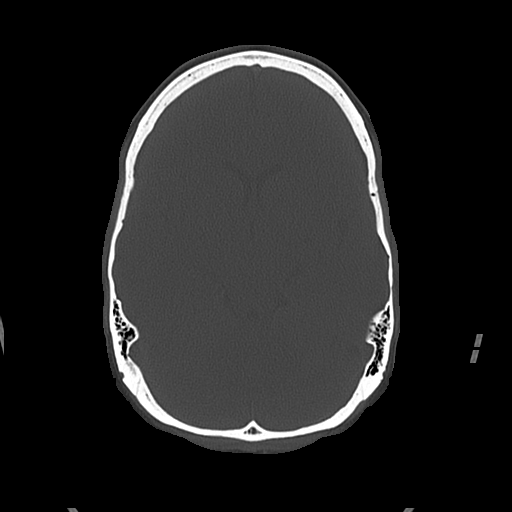
[im 35/78  bone]
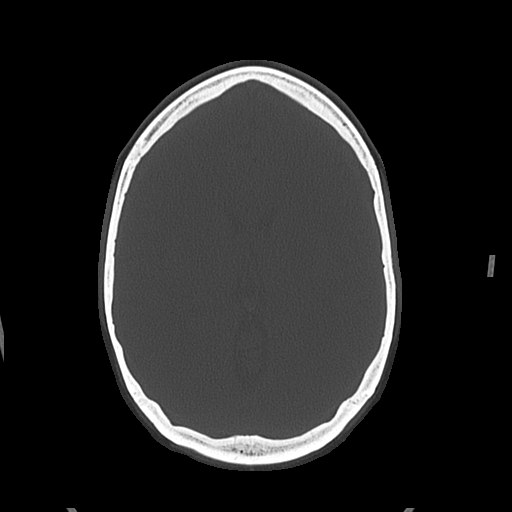

[Series 4: coronal soft · coronal · 0.31mm/px · 3 of 64 slices shown]
[im 22/64  brain]
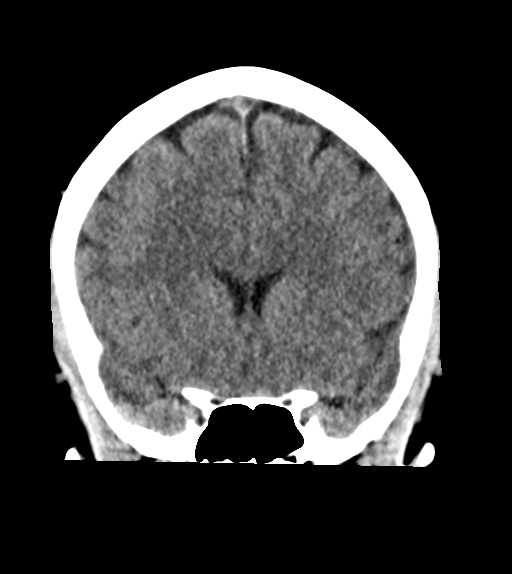
[im 29/64  brain]
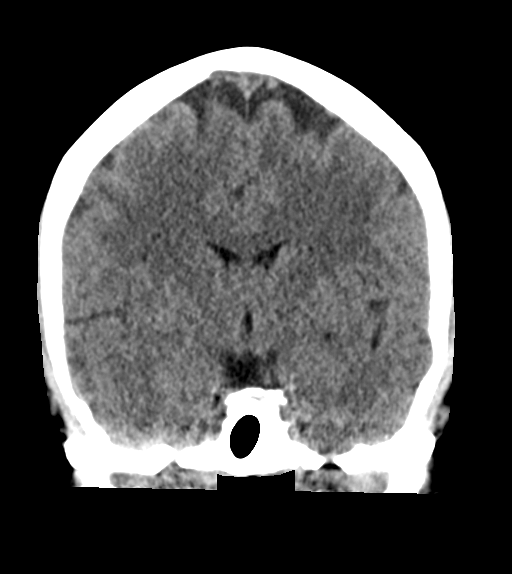
[im 36/64  brain]
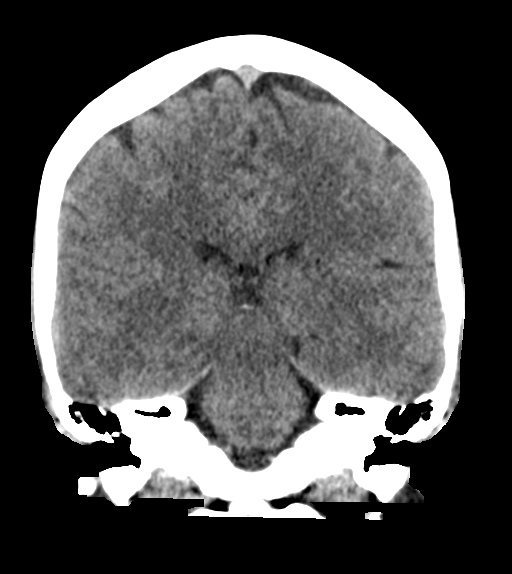

[Series 5: sagittal soft · sagittal · 0.35mm/px · 3 of 49 slices shown]
[im 17/49  brain]
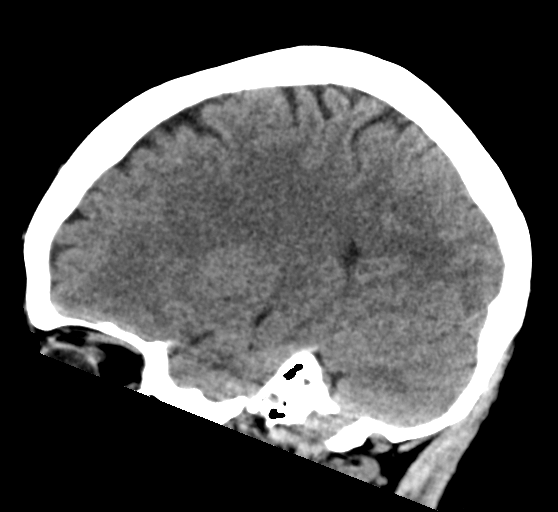
[im 25/49  brain]
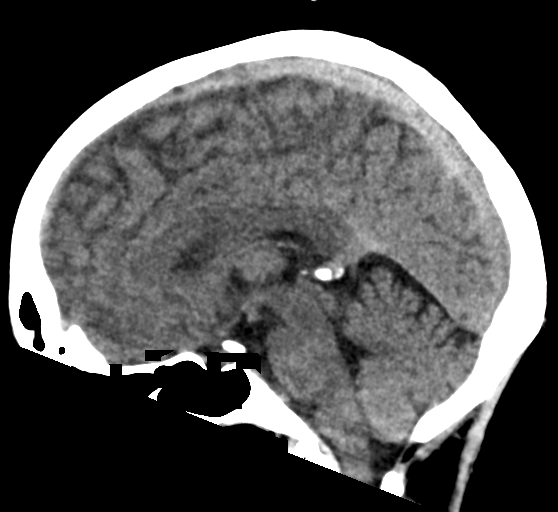
[im 33/49  brain]
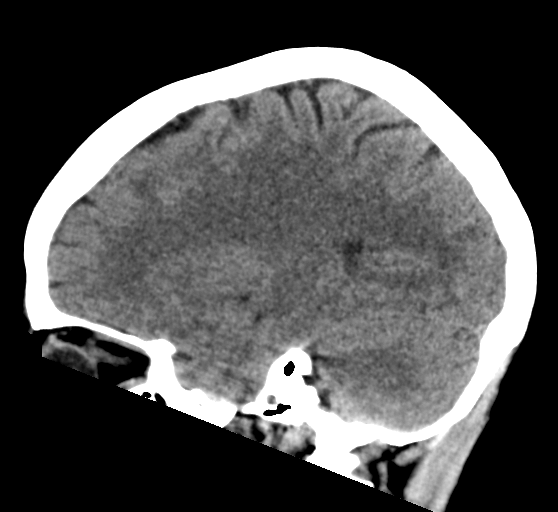

[17 of 47 positions shown; findings below may reference images not displayed]

FINDINGS: CT HEAD FINDINGS

Brain: No evidence of acute infarction, hemorrhage, hydrocephalus,
extra-axial collection or mass lesion/mass effect.

Vascular: No hyperdense vessel or unexpected calcification.

Skull: Normal. Negative for fracture or focal lesion.

Sinuses/Orbits: No acute finding.

Other: None

CT CERVICAL SPINE FINDINGS

Alignment: There is reversal of normal cervical lordosis which may
be positional or due to muscle spasm. No acute subluxation.

Skull base and vertebrae: No acute fracture. No primary bone lesion
or focal pathologic process.

Soft tissues and spinal canal: No prevertebral fluid or swelling. No
visible canal hematoma.

Disc levels:  Mild degenerative changes.

Upper chest: Negative.

Other: None
IMPRESSION: 1. Normal noncontrast CT of the brain.
2. No acute/traumatic cervical spine pathology.

## 2022-11-14 IMAGING — CT CT CERVICAL SPINE W/O CM
3 of 4 series · 12 of 33 positions shown, 14 images · non-contrast
Comparison: None.

CLINICAL DATA: Trauma.

EXAM:
CT HEAD WITHOUT CONTRAST
CT CERVICAL SPINE WITHOUT CONTRAST
TECHNIQUE: Multidetector CT imaging of the head and cervical spine was
performed following the standard protocol without intravenous
contrast. Multiplanar CT image reconstructions of the cervical spine
were also generated.

[Series 5: cor bone · coronal · 0.23mm/px · 3 of 49 slices shown]
[im 10/49  bone]
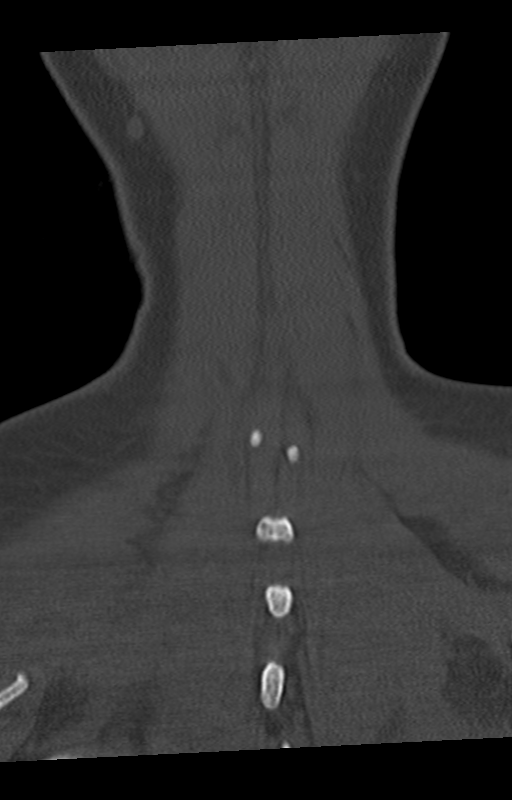
[im 20/49  bone]
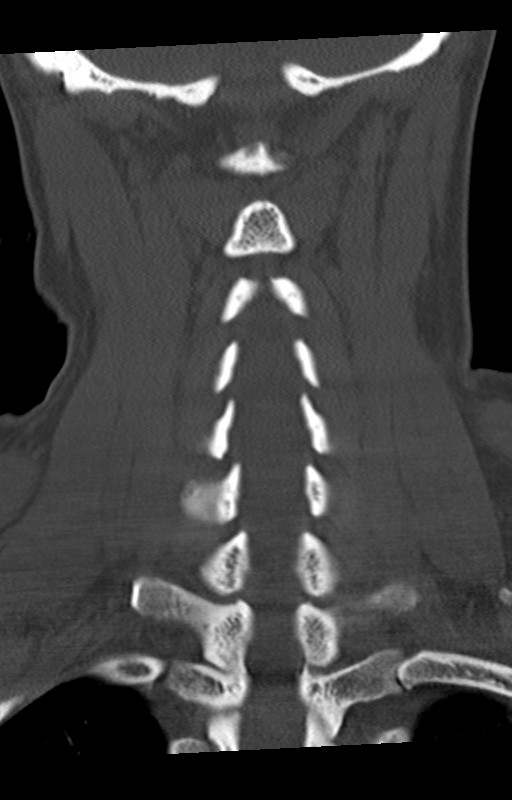
[im 29/49  bone]
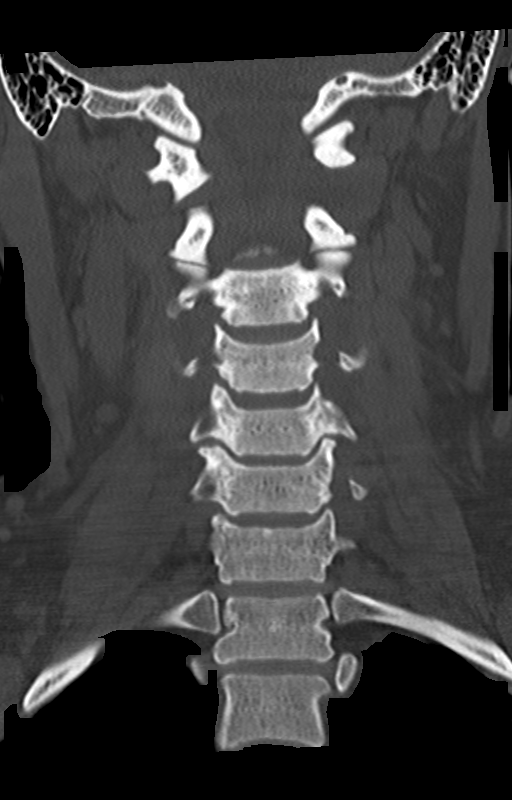

[Series 6: sag bone · sagittal · 0.22mm/px · 5 of 46 slices shown, 6 images]
[im 16/46  bone]
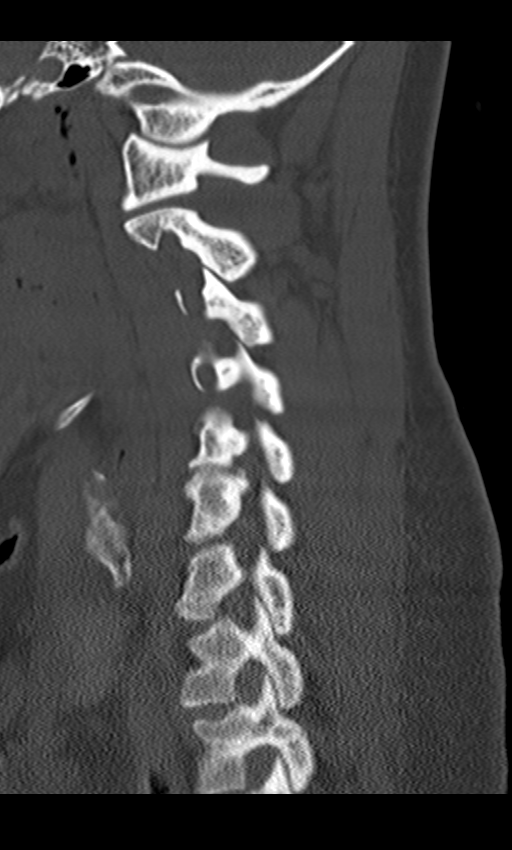
[im 19/46  bone]
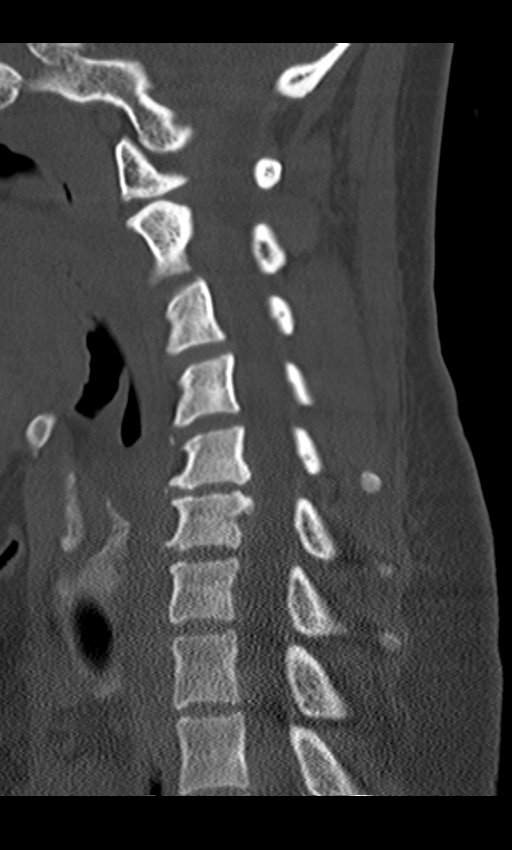
[im 23/46  soft-tissue]
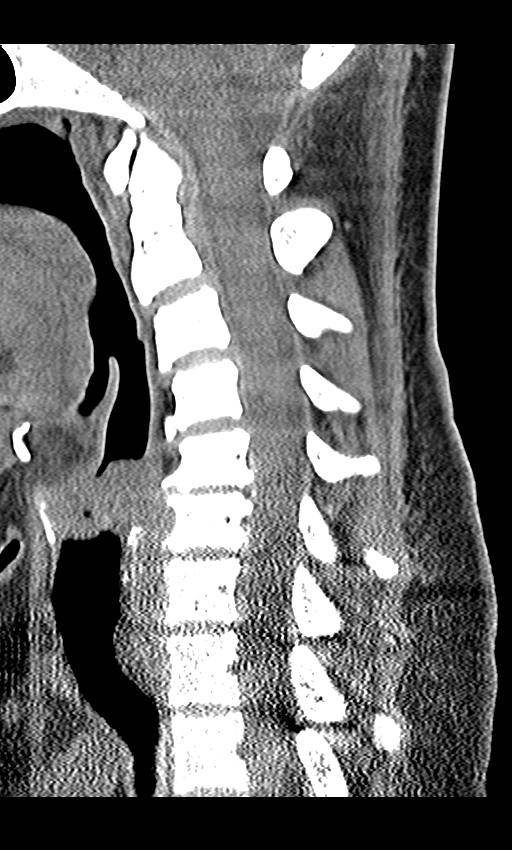
[im 23/46  bone]
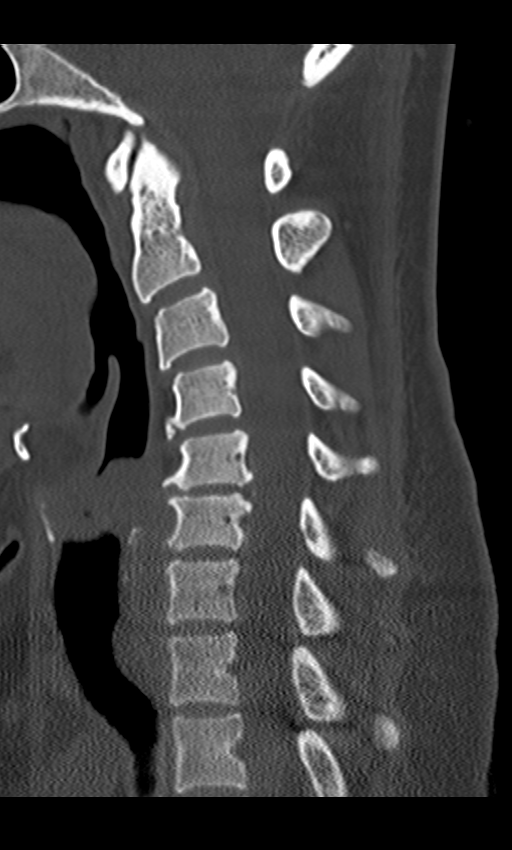
[im 27/46  bone]
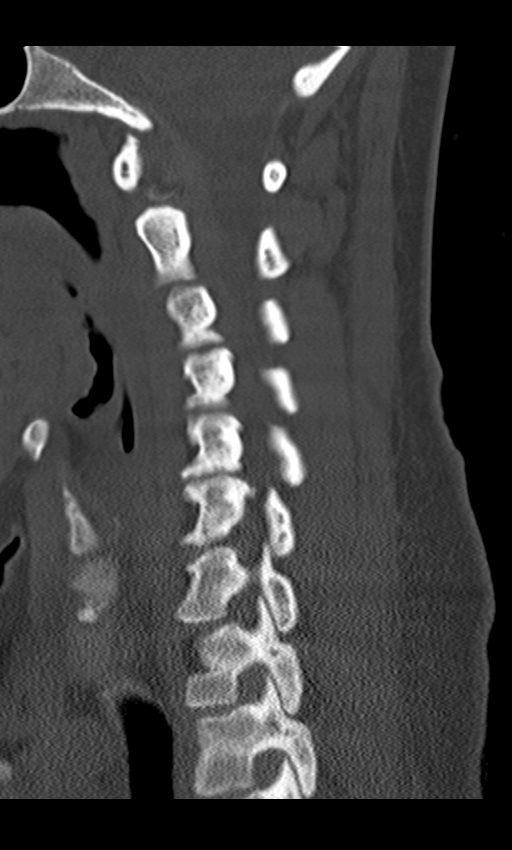
[im 31/46  bone]
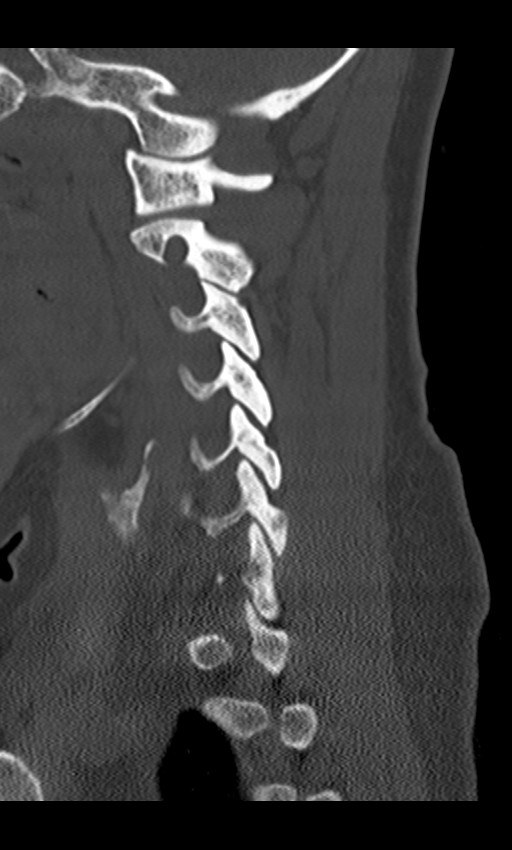

[Series 7: orthogonal axials · axial · 0.21mm/px · z∈[+1076,+1168]mm · 4 of 85 slices shown, 5 images]
[im 15/85  soft-tissue]
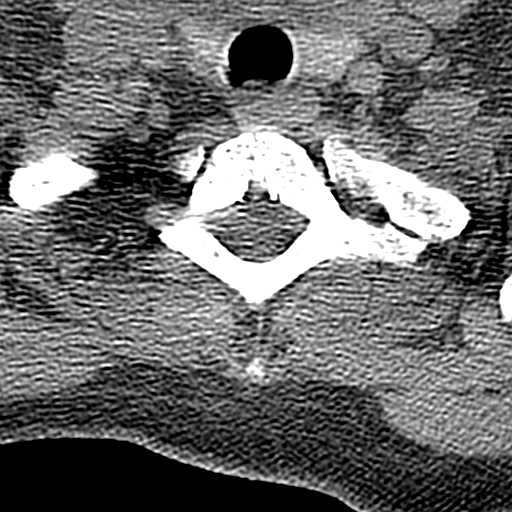
[im 15/85  bone]
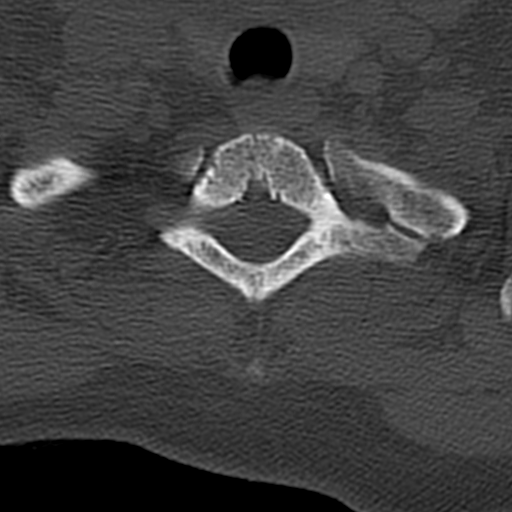
[im 29/85  bone]
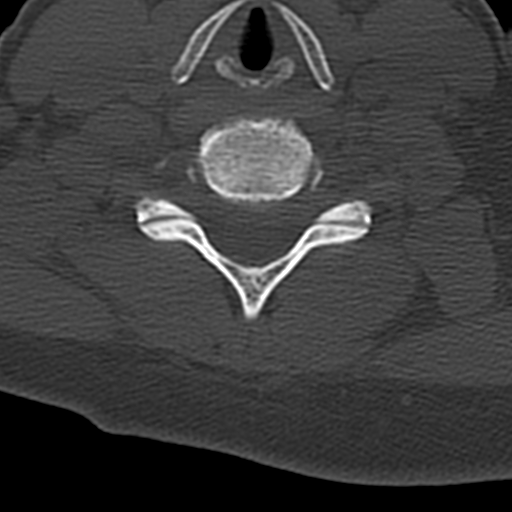
[im 57/85  bone]
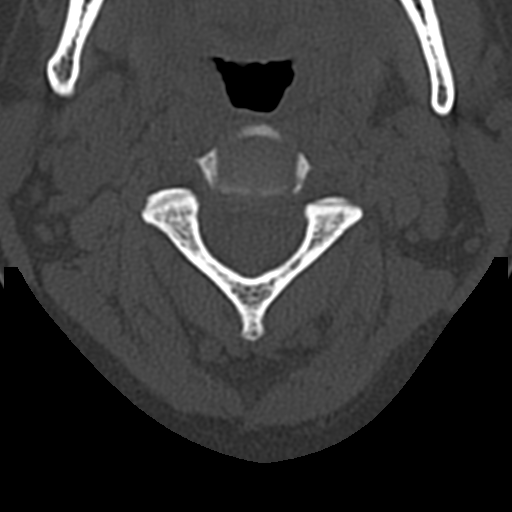
[im 71/85  bone]
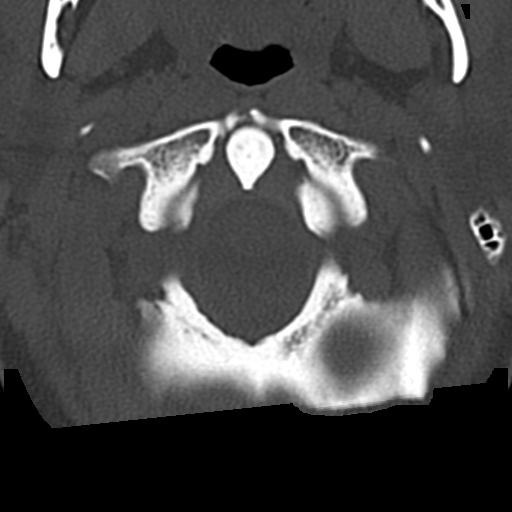

[12 of 33 positions shown; findings below may reference images not displayed]

FINDINGS: CT HEAD FINDINGS

Brain: No evidence of acute infarction, hemorrhage, hydrocephalus,
extra-axial collection or mass lesion/mass effect.

Vascular: No hyperdense vessel or unexpected calcification.

Skull: Normal. Negative for fracture or focal lesion.

Sinuses/Orbits: No acute finding.

Other: None

CT CERVICAL SPINE FINDINGS

Alignment: There is reversal of normal cervical lordosis which may
be positional or due to muscle spasm. No acute subluxation.

Skull base and vertebrae: No acute fracture. No primary bone lesion
or focal pathologic process.

Soft tissues and spinal canal: No prevertebral fluid or swelling. No
visible canal hematoma.

Disc levels:  Mild degenerative changes.

Upper chest: Negative.

Other: None
IMPRESSION: 1. Normal noncontrast CT of the brain.
2. No acute/traumatic cervical spine pathology.
